# Patient Record
Sex: Male | Born: 1985 | Race: Black or African American | Hispanic: No | Marital: Single | Smoking: Never smoker
Health system: Southern US, Community
[De-identification: ages and names within clinical notes are randomized; demographics above are authoritative.]

## PROBLEM LIST (undated history)

## (undated) DIAGNOSIS — K2970 Gastritis, unspecified, without bleeding: Secondary | ICD-10-CM

## (undated) DIAGNOSIS — R079 Chest pain, unspecified: Secondary | ICD-10-CM

## (undated) DIAGNOSIS — D573 Sickle-cell trait: Secondary | ICD-10-CM

## (undated) DIAGNOSIS — G43909 Migraine, unspecified, not intractable, without status migrainosus: Secondary | ICD-10-CM

## (undated) HISTORY — DX: Migraine, unspecified, not intractable, without status migrainosus: G43.909

## (undated) HISTORY — DX: Gastritis, unspecified, without bleeding: K29.70

## (undated) HISTORY — PX: WISDOM TOOTH EXTRACTION: SHX21

## (undated) HISTORY — DX: Chest pain, unspecified: R07.9

---
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
## 2007-04-27 ENCOUNTER — Encounter: Admission: RE | Admit: 2007-04-27 | Discharge: 2007-04-27 | Payer: Self-pay | Admitting: Family Medicine

## 2009-12-06 ENCOUNTER — Emergency Department (HOSPITAL_COMMUNITY): Admission: EM | Admit: 2009-12-06 | Discharge: 2009-12-06 | Payer: Self-pay | Admitting: Emergency Medicine

## 2011-03-08 NOTE — Consult Note (Signed)
[place]. [facility]  Patient:    Zachary Roy, Zachary Roy                      MRN: [id number] Proc. Date: 07/09/00 Adm. Date:  [id number] Attending:  Leonia Corona CC:         Elana Alm. Eliezer Lofts., M.D.  Shuaib M. Leeanne Mannan, M.D.   Consultation Report  CHIEF COMPLAINT:  Abdominal pain.  HISTORY OF PRESENT ILLNESS:  This is an 25 year old male with a long history of recurrent abdominal pain with negative workup, who was admitted last night with abdominal pain, question of early appendicitis on CT scan.  Since admission, white count has gone from 6.2 to 4.5 and exam has remained benign. CT has been reviewed and consensus is likelihood of appendicitis is low.  The patient had acute illness beginning with a sore throat one week ago.  With Tussionex and Robitussin, symptoms improved, and he three days ago developed worsening sore throat, diarrhea x 3-4, and crampy abdominal pain.  Mom brought patient to the emergency department two days ago, and pain resolved with morphine.  Next day, patient saw Dr. Nicholos Johns as an outpatient.  Strep test was positive, and he was started on amoxicillin and Pepcid AC.  GI referral was set for October 30 for evaluation of his recurrent abdominal pain.  Last night severe crampy pain, periumbilical without radiation, occurred.  No fever.  No vomiting except for the contrast.  No nasal congestion or cough.  No hematuria, dysuria, or blood in the stool.  The pain was similar to prior pain but this time did not radiate to the testicles.  The patient and mom deny any stresses at home or at school.  No history of abuse.  Prior history reveals initial abdominal pain in January.  Workup has included five testicular ultrasounds.  One shows slightly enlarged epididymis.  One with question of hydrocele.  Two negative CT scans of the abdomen and pelvis. Negative evaluation by pediatric urologist at [facility] and [facility].  Negative psychological  evaluation at [facility]  Pain improved over the summer but now has recurred.  PAST MEDICAL HISTORY:   Abdominal pain.  PAST SURGICAL HISTORY:  None.  MEDICATIONS:  Amoxicillin, Pepcid AC.  ALLERGIES:  No known drug allergies.  SOCIAL HISTORY:  Patient lives with mom, is in sixth grade at [employer].  Likes to stay to himself.  Enjoys _____ and running.  Enjoys math.  FAMILY HISTORY:  Mom has fibromylagia, gastroesophageal reflux disease, and sickle cell trait.  Dad has gastroesophageal reflux disease.  REVIEW OF SYSTEMS:  Negative for weight loss, dysuria, joint pain, visual changes, and negative in all system groups.  PHYSICAL EXAMINATION:  VITAL SIGNS:  Temperature 97.9, BP 115/68, pulse 62, respirations 22.  GENERAL:  WDWNBM, resting comfortably.  HEENT:  Pupils are equal, round and reactive to light.  Oropharynx erythematous.  NECK:  Supple.  No lymphadenopathy.  CARDIAC:  Regular rate and rhythm without murmur.  LUNGS:  Clear to auscultation.  ABDOMEN:  Soft, nondistended, nontender, positive bowel sounds, no rebound or guarding.  GROIN:  Normal male genitalia.  No testicular masses or pain.  EXTREMITIES:  No edema, no joint swelling.  LABORATORY DATA:  White count 6.5, which has come down from 9.5.  ASSESSMENT:  An 25 year old male with chronic recurrent abdominal pain with extensive negative workup.  Current episode may have been initially by strep throat.  I still consider underlying psychological gain.  GI referral is reasonable as an outpatient.  PLAN:  Agree with continued monitoring overnight.  He will remain on penicillin for strep throat.  Will ask Kathrynn Running to see him in the morning, and GI evaluation as an outpatient. DD:  07/09/00 TD:  07/10/00 Job: [id number]

## 2011-03-08 NOTE — Op Note (Signed)
Zachary Roy, Zachary Roy               ACCOUNT NO.:  [id number]   MEDICAL RECORD NO.:  [id number]          PATIENT TYPE:  AMB   LOCATION:  SDS                          FACILITY:  [facility]   PHYSICIAN:  Jon Gills, M.D.  DATE OF BIRTH:  1985-11-24   DATE OF PROCEDURE:  05/09/2006  DATE OF DISCHARGE:  05/09/2006                                 OPERATIVE REPORT   PREOP DIAGNOSIS:  Chest pain, hematemesis and dysphagia.   POSTOP DIAGNOSIS:  Mild - moderate reflux esophagitis.   NAME OF OPERATION:  Upper GI endoscopy with biopsy.   SURGEON:  Jon Gills, MD   ASSISTANT:  None.   DESCRIPTION OF FINDINGS:  Following informed written consent, the patient  was taken to the operating room and placed under general anesthesia with  continuous cardiopulmonary monitoring.  He remained in the supine position  and Olympus endoscope was passed by mouth without difficulty.  Several  linear erosions were present in the distal esophagus but otherwise the  esophagus, stomach and duodenum were grossly normal.  A solitary gastric  biopsy was negative for Helicobacter.  Esophageal biopsies confirmed mild -  moderate reflux esophagitis.  Gastric and duodenal biopsies were  unremarkable.  The endoscope was gradually withdrawn and the patient was  awakened and taken to recovery room in satisfactory condition.  He will be  released later today to the care of his family.  I anticipate adding Reglan  therapy to his PPI medication.   DESCRIPTION AND TECHNICAL PROCEDURE USED:  Olympus GIF - 140 endoscope with  cold biopsy forceps.   DESCRIPTION OF SPECIMENS REMOVED:  Esophagus x3 in formalin, gastric x1 in  CLOtest, gastric x3 in formalin, duodenum x3 in formalin.           ______________________________  Jon Gills, M.D.     JHC/MEDQ  D:  05/23/2006  T:  05/23/2006  Job:  [id number]   cc:   Edson Snowball, M.D.

## 2011-11-12 ENCOUNTER — Encounter (HOSPITAL_COMMUNITY): Payer: Self-pay | Admitting: Emergency Medicine

## 2011-11-12 ENCOUNTER — Emergency Department (HOSPITAL_COMMUNITY)
Admission: EM | Admit: 2011-11-12 | Discharge: 2011-11-12 | Disposition: A | Discharge status: Home / Self Care | Payer: No Typology Code available for payment source | Attending: Emergency Medicine | Admitting: Emergency Medicine

## 2011-11-12 DIAGNOSIS — M542 Cervicalgia: Secondary | ICD-10-CM | POA: Insufficient documentation

## 2011-11-12 DIAGNOSIS — R51 Headache: Secondary | ICD-10-CM | POA: Insufficient documentation

## 2011-11-12 DIAGNOSIS — T1490XA Injury, unspecified, initial encounter: Secondary | ICD-10-CM | POA: Insufficient documentation

## 2011-11-12 HISTORY — DX: Sickle-cell trait: D57.3

## 2011-11-12 MED ORDER — DIAZEPAM 5 MG PO TABS: 5.0000 mg | ORAL_TABLET | Freq: Two times a day (BID) | ORAL | Status: AC
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
## 2011-11-14 NOTE — ED Provider Notes (Signed)
Medical screening examination/treatment/procedure(s) were performed by non-physician practitioner and as supervising physician I was immediately available for consultation/collaboration.   Forbes Cellar, MD 11/14/11 1325

## 2012-01-15 ENCOUNTER — Encounter (HOSPITAL_COMMUNITY): Payer: Self-pay | Admitting: *Deleted

## 2012-01-15 ENCOUNTER — Emergency Department (INDEPENDENT_AMBULATORY_CARE_PROVIDER_SITE_OTHER)
Admission: EM | Admit: 2012-01-15 | Discharge: 2012-01-15 | Disposition: A | Discharge status: Home / Self Care | Payer: Self-pay | Source: Home / Self Care | Attending: Emergency Medicine | Admitting: Emergency Medicine

## 2012-01-15 DIAGNOSIS — G43909 Migraine, unspecified, not intractable, without status migrainosus: Secondary | ICD-10-CM

## 2012-01-15 MED ORDER — MELATONIN 3 MG PO TABS: 3.0000 mg | ORAL_TABLET | Freq: Every day | ORAL | Status: DC

## 2012-01-15 MED ORDER — DEXAMETHASONE SODIUM PHOSPHATE 10 MG/ML IJ SOLN
INTRAMUSCULAR | Status: AC
  Filled 2012-01-15: qty 1

## 2012-01-15 MED ORDER — DEXAMETHASONE SODIUM PHOSPHATE 10 MG/ML IJ SOLN
10.0000 mg | Freq: Once | INTRAMUSCULAR | Status: AC
  Administered 2012-01-15: 10 mg via INTRAMUSCULAR

## 2012-01-15 MED ORDER — PREDNISONE 5 MG PO KIT: 1.0000 | PACK | Freq: Every day | ORAL | Status: DC

## 2012-01-15 MED ORDER — DIPHENHYDRAMINE HCL 50 MG/ML IJ SOLN
INTRAMUSCULAR | Status: AC
  Filled 2012-01-15: qty 1

## 2012-01-15 MED ORDER — DIPHENHYDRAMINE HCL 50 MG/ML IJ SOLN
25.0000 mg | Freq: Once | INTRAMUSCULAR | Status: AC
  Administered 2012-01-15: 25 mg via INTRAMUSCULAR

## 2012-01-15 NOTE — Discharge Instructions (Signed)
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
## 2012-01-22 ENCOUNTER — Encounter (HOSPITAL_COMMUNITY): Payer: Self-pay

## 2012-01-22 ENCOUNTER — Emergency Department (HOSPITAL_COMMUNITY)
Admission: EM | Admit: 2012-01-22 | Discharge: 2012-01-22 | Disposition: A | Discharge status: Home / Self Care | Payer: PRIVATE HEALTH INSURANCE | Attending: Emergency Medicine | Admitting: Emergency Medicine

## 2012-01-22 DIAGNOSIS — K2970 Gastritis, unspecified, without bleeding: Secondary | ICD-10-CM | POA: Insufficient documentation

## 2012-01-22 DIAGNOSIS — J45909 Unspecified asthma, uncomplicated: Secondary | ICD-10-CM | POA: Insufficient documentation

## 2012-01-22 DIAGNOSIS — K920 Hematemesis: Secondary | ICD-10-CM | POA: Insufficient documentation

## 2012-01-22 MED ORDER — SUCRALFATE 1 G PO TABS: 1.0000 g | ORAL_TABLET | Freq: Four times a day (QID) | ORAL | Status: DC

## 2012-01-22 MED ORDER — ONDANSETRON 8 MG PO TBDP
8.0000 mg | ORAL_TABLET | Freq: Once | ORAL | Status: AC
  Administered 2012-01-22: 8 mg via ORAL
  Filled 2012-01-22: qty 1

## 2012-01-22 MED ORDER — OMEPRAZOLE 20 MG PO CPDR: 20.0000 mg | DELAYED_RELEASE_CAPSULE | Freq: Every day | ORAL | Status: DC

## 2012-01-22 MED ORDER — ONDANSETRON HCL 4 MG PO TABS: 4.0000 mg | ORAL_TABLET | Freq: Four times a day (QID) | ORAL | Status: AC

## 2012-01-22 MED ORDER — PANTOPRAZOLE SODIUM 40 MG PO TBEC
40.0000 mg | DELAYED_RELEASE_TABLET | Freq: Every day | ORAL | Status: DC
  Administered 2012-01-22: 40 mg via ORAL
  Filled 2012-01-22: qty 1

## 2012-01-22 MED ORDER — SUCRALFATE 1 G PO TABS
1.0000 g | ORAL_TABLET | Freq: Once | ORAL | Status: AC
  Administered 2012-01-22: 1 g via ORAL
  Filled 2012-01-22: qty 1

## 2012-01-22 NOTE — ED Notes (Signed)
Pt complains of seeing blood in his vomit tonight, he also complains of a migraine

## 2012-01-22 NOTE — Discharge Instructions (Signed)
Take medications as prescribed.  Return to the ER for continued vomiting, increased blood in your vomiting, worsening pain, or other concerning symptoms.  Follow up with your doctor for recheck in 1 week.  Gastritis Gastritis is an inflammation (the body's way of reacting to injury and/or infection) of the stomach. It is often caused by viral or bacterial (germ) infections. It can also be caused by chemicals (including alcohol) and medications. This illness may be associated with generalized malaise (feeling tired, not well), cramps, and fever. The illness may last 2 to 7 days. If symptoms of gastritis continue, gastroscopy (looking into the stomach with a telescope-like instrument), biopsy (taking tissue samples), and/or blood tests may be necessary to determine the cause. Antibiotics will not affect the illness unless there is a bacterial infection present. One common bacterial cause of gastritis is an organism known as H. Pylori. This can be treated with antibiotics. Other forms of gastritis are caused by too much acid in the stomach. They can be treated with medications such as H2 blockers and antacids. Home treatment is usually all that is needed. Young children will quickly become dehydrated (loss of body fluids) if vomiting and diarrhea are both present. Medications may be given to control nausea. Medications are usually not given for diarrhea unless especially bothersome. Some medications slow the removal of the virus from the gastrointestinal tract. This slows down the healing process. HOME CARE INSTRUCTIONS Home care instructions for nausea and vomiting:  For adults: drink small amounts of fluids often. Drink at least 2 quarts a day. Take sips frequently. Do not drink large amounts of fluid at one time. This may worsen the nausea.   Only take over-the-counter or prescription medicines for pain, discomfort, or fever as directed by your caregiver.   Drink clear liquids only. Those are anything you  can see through such as water, broth, or soft drinks.   Once you are keeping clear liquids down, you may start full liquids, soups, juices, and ice cream or sherbet. Slowly add bland (plain, not spicy) foods to your diet.  Home care instructions for diarrhea:  Diarrhea can be caused by bacterial infections or a virus. Your condition should improve with time, rest, fluids, and/or anti-diarrheal medication.   Until your diarrhea is under control, you should drink clear liquids often in small amounts. Clear liquids include: water, broth, jell-o water and weak tea.  Avoid:  Milk.   Fruits.   Tobacco.   Alcohol.   Extremely hot or cold fluids.   Too much intake of anything at one time.  When your diarrhea stops you may add the following foods, which help the stool to become more formed:  Rice.   Bananas.   Apples without skin.   Dry toast.  Once these foods are tolerated you may add low-fat yogurt and low-fat cottage cheese. They will help to restore the normal bacterial balance in your bowel. Wash your hands well to avoid spreading bacteria (germ) or virus. SEEK IMMEDIATE MEDICAL CARE IF:   You are unable to keep fluids down.   Vomiting or diarrhea become persistent (constant).   Abdominal pain develops, increases, or localizes. (Right sided pain can be appendicitis. Left sided pain in adults can be diverticulitis.)   You develop a fever (an oral temperature above 102 F (38.9 C)).   Diarrhea becomes excessive or contains blood or mucus.   You have excessive weakness, dizziness, fainting or extreme thirst.   You are not improving or you are getting  worse.   You have any other questions or concerns.  Document Released: 10/01/2001 Document Revised: 09/26/2011 Document Reviewed: 10/07/2005 [facility] Patient Information 2012 [place], [place].

## 2012-01-22 NOTE — ED Provider Notes (Signed)
History     CSN: [id number]  Arrival date & time 01/22/12  0014   First MD Initiated Contact with Patient 01/22/12 0125      Chief Complaint  Patient presents with  . Hematemesis    (Consider location/radiation/quality/duration/timing/severity/associated sxs/prior treatment) HPI 26 year old male presents to emergency department with report of one episode of vomiting with streaks of blood. Patient reports he has had ongoing pain after eating for several months. He has not sought evaluation for this. Patient was seen in urgent care 2 to headaches, and was started on melatonin and prednisone. Patient reports some upper abdominal pain currently. No fever no chills no diarrhea. He has a mild headache at this time which is consistent with his normal headaches. Patient reports having upper and lower endoscopy done about 5 years ago. He does not remember why he had this done or what the results were. No further vomiting. No change in the color of his stools. Patient specifically denies any black tarry or sticky stools. Past Medical History  Diagnosis Date  . Asthma   . Sickle cell trait     History reviewed. No pertinent past surgical history.  Family History  Problem Relation Age of Onset  . Diabetes Mother   . Diabetes Other     History  Substance Use Topics  . Smoking status: Never Smoker   . Smokeless tobacco: Not on file  . Alcohol Use: No      Review of Systems  All other systems reviewed and are negative.   other than stated in history of present illness  Allergies  Motrin and Codeine  Home Medications   Current Outpatient Rx  Name Route Sig Dispense Refill  . MELATONIN 3 MG PO TABS Oral Take 1 tablet (3 mg total) by mouth at bedtime. 30 tablet 0  . PREDNISONE 5 MG PO KIT Oral Take 1 kit (5 mg total) by mouth daily after breakfast. Prednisone 5 mg 6 day dosepack.  Take as directed. 1 kit 0  . OMEPRAZOLE 20 MG PO CPDR Oral Take 1 capsule (20 mg total) by mouth  daily. 20 capsule 0  . ONDANSETRON HCL 4 MG PO TABS Oral Take 1 tablet (4 mg total) by mouth every 6 (six) hours. 12 tablet 0  . SUCRALFATE 1 G PO TABS Oral Take 1 tablet (1 g total) by mouth 4 (four) times daily. 30 tablet 0    BP 141/64  Pulse 71  Temp(Src) 97.7 F (36.5 C) (Oral)  Resp 18  Wt 150 lb (68.04 kg)  SpO2 99%  Physical Exam  Nursing note and vitals reviewed. Constitutional: He is oriented to person, place, and time. He appears well-developed and well-nourished.  HENT:  Head: Normocephalic and atraumatic.  Right Ear: External ear normal.  Left Ear: External ear normal.  Nose: Nose normal.  Mouth/Throat: Oropharynx is clear and moist.  Eyes: Conjunctivae and EOM are normal. Pupils are equal, round, and reactive to light.  Neck: Normal range of motion. Neck supple. No JVD present. No tracheal deviation present. No thyromegaly present.  Cardiovascular: Normal rate, regular rhythm, normal heart sounds and intact distal pulses.  Exam reveals no gallop and no friction rub.   No murmur heard. Pulmonary/Chest: Effort normal and breath sounds normal. No stridor. No respiratory distress. He has no wheezes. He has no rales. He exhibits no tenderness.  Abdominal: Soft. Bowel sounds are normal. He exhibits no distension and no mass. There is tenderness (mild epigastric tenderness). There is no rebound  and no guarding.  Musculoskeletal: Normal range of motion. He exhibits no edema and no tenderness.  Lymphadenopathy:    He has no cervical adenopathy.  Neurological: He is oriented to person, place, and time. He exhibits normal muscle tone. Coordination normal.  Skin: Skin is dry. No rash noted. No erythema. No pallor.  Psychiatric: He has a normal mood and affect. His behavior is normal. Judgment and thought content normal.    ED Course  Procedures (including critical care time)  Labs Reviewed - No data to display No results found.   1. Gastritis   2. Vomiting blood        MDM  26 year old male with one episode of vomiting with streaks of blood. History suggests possible gastritis or peptic ulcer disease may have been exacerbated with recent prednisone use. Patient clinically appears well no signs of active GI bleeding hemodynamically stable. Will start patient on Prilosec and Carafate. Patient to followup with primary care Dr. for possible referral to GI        Olivia Mackie, MD 01/22/12 [phone]

## 2012-01-22 NOTE — ED Notes (Signed)
Pt presents with c/c of hematemesis.  St's he only vomited once and he noticed copious amounts of blood in his vomit.  No signs of hypovolemia.  No dizziness/lightheadedness/SOB.  Cap refill < 3 seconds.  Mentation WNL.

## 2012-02-25 ENCOUNTER — Encounter (HOSPITAL_COMMUNITY): Payer: Self-pay | Admitting: *Deleted

## 2012-02-25 ENCOUNTER — Emergency Department (HOSPITAL_COMMUNITY)
Admission: EM | Admit: 2012-02-25 | Discharge: 2012-02-26 | Disposition: A | Discharge status: Home / Self Care | Payer: PRIVATE HEALTH INSURANCE | Attending: Emergency Medicine | Admitting: Emergency Medicine

## 2012-02-25 ENCOUNTER — Emergency Department (HOSPITAL_COMMUNITY): Payer: PRIVATE HEALTH INSURANCE

## 2012-02-25 DIAGNOSIS — R0602 Shortness of breath: Secondary | ICD-10-CM | POA: Insufficient documentation

## 2012-02-25 DIAGNOSIS — R079 Chest pain, unspecified: Secondary | ICD-10-CM | POA: Insufficient documentation

## 2012-02-25 DIAGNOSIS — J45909 Unspecified asthma, uncomplicated: Secondary | ICD-10-CM | POA: Insufficient documentation

## 2012-02-25 LAB — COMPREHENSIVE METABOLIC PANEL
ALT: 16 U/L (ref 0–53)
Alkaline Phosphatase: 49 U/L (ref 39–117)
CO2: 29 mEq/L (ref 19–32)
GFR calc Af Amer: 88 mL/min — ABNORMAL LOW (ref >90)
GFR calc non Af Amer: 76 mL/min — ABNORMAL LOW (ref >90)
Glucose, Bld: 91 mg/dL (ref 70–99)
Potassium: 4.6 mEq/L (ref 3.5–5.1)
Sodium: 141 mEq/L (ref 135–145)

## 2012-02-25 LAB — CBC
Hemoglobin: 15 g/dL (ref 13.0–17.0)
MCV: 82.8 fL (ref 78.0–100.0)
Platelets: 245 10*3/uL (ref 150–400)
RBC: 5.22 MIL/uL (ref 4.22–5.81)
WBC: 7.6 10*3/uL (ref 4.0–10.5)

## 2012-02-25 LAB — DIFFERENTIAL
Eosinophils Relative: 2 % (ref 0–5)
Lymphocytes Relative: 20 % (ref 12–46)
Lymphs Abs: 1.5 10*3/uL (ref 0.7–4.0)
Monocytes Relative: 6 % (ref 3–12)
Neutrophils Relative %: 72 % (ref 43–77)

## 2012-02-25 NOTE — ED Notes (Signed)
The pt is c/o mid-chest pain for 3-4 days with some sob none now

## 2012-02-26 MED ORDER — COLCHICINE 0.6 MG PO TABS: 0.6000 mg | ORAL_TABLET | Freq: Every day | ORAL | Status: DC

## 2012-02-26 NOTE — Discharge Instructions (Signed)
Return to the ED with any concerns including difficulty breathing, worsening pain, leg swelling, fainting, decreased level of alertness/lethargy, or any other alarming symptoms °

## 2012-02-26 NOTE — ED Notes (Signed)
Pt denies any questions upon discharge. 

## 2012-02-26 NOTE — ED Provider Notes (Signed)
History     CSN: [id number]  Arrival date & time 02/25/12  1933   First MD Initiated Contact with Patient 02/25/12 2333      Chief Complaint  Patient presents with  . Chest Pain    (Consider location/radiation/quality/duration/timing/severity/associated sxs/prior treatment) HPI Pt presents with c/o chest pain and difficulty breathing.  He states the symptoms have been going on for several weeks, but worse over the past few days.  Pain is worse with deep breaths.  Also chest wall is sore to touch.  No leg swelling, no hx dvt/pe, no recent travel injuries or surgeries.  Mild cough, no fever/chills.  No rash or other recent illness.  He has not tried any treatement prior to arrival.  There are no alleviating or modifying factors.  There are no other associated systemic treatments   Past Medical History  Diagnosis Date  . Asthma   . Sickle cell trait     History reviewed. No pertinent past surgical history.  Family History  Problem Relation Age of Onset  . Diabetes Mother   . Diabetes Other     History  Substance Use Topics  . Smoking status: Never Smoker   . Smokeless tobacco: Not on file  . Alcohol Use: No      Review of Systems ROS reviewed and all otherwise negative except for mentioned in HPI  Allergies  Motrin and Codeine  Home Medications   Current Outpatient Rx  Name Route Sig Dispense Refill  . MELATONIN 3 MG PO TABS Oral Take 1 tablet (3 mg total) by mouth at bedtime. 30 tablet 0  . OMEPRAZOLE 20 MG PO CPDR Oral Take 1 capsule (20 mg total) by mouth daily. 20 capsule 0  . SUCRALFATE 1 G PO TABS Oral Take 1 tablet (1 g total) by mouth 4 (four) times daily. 30 tablet 0  . COLCHICINE 0.6 MG PO TABS Oral Take 1 tablet (0.6 mg total) by mouth daily. 14 tablet 0    BP 118/67  Pulse 71  Temp(Src) 98.3 F (36.8 C) (Oral)  Resp 20  SpO2 100% Vitals reviewed Physical Exam Physical Examination: General appearance - alert, well appearing, and in no  distress Mental status - alert, oriented to person, place, and time Eyes - pupils equal and reactive, no conjunctival injection or scleral icterus Mouth - mucous membranes moist, pharynx normal without lesions Chest - clear to auscultation, no wheezes, rales or rhonchi, symmetric air entry, chest wall tender to palpation at costrochondral junction Heart - normal rate, regular rhythm, normal S1, S2, no murmurs, rubs, clicks or gallops Abdomen - soft, nontender, nondistended, no masses or organomegaly, nabs Neurological - alert, oriented, normal speech, no focal findings Musculoskeletal - no joint tenderness, deformity or swelling Extremities - peripheral pulses normal, no pedal edema, no clubbing or cyanosis Skin - normal coloration and turgor, no rashes  ED Course  Procedures (including critical care time)  Date: 02/26/2012  Rate: 63  Rhythm: normal sinus rhythm  QRS Axis: normal  Intervals: normal  ST/T Wave abnormalities: ST elevations diffusely c/w early repolarization versus pericarditis  Conduction Disutrbances:none  Narrative Interpretation:   Old EKG Reviewed: none available    Labs Reviewed  COMPREHENSIVE METABOLIC PANEL - Abnormal; Notable for the following:    Total Bilirubin 1.9 (*)    GFR calc non Af Amer 76 (*)    GFR calc Af Amer 88 (*)    All other components within normal limits  CBC  DIFFERENTIAL  CK TOTAL  AND CKMB  TROPONIN I  POCT I-STAT TROPONIN I  LAB REPORT - SCANNED   Dg Chest 2 View  02/25/2012  *RADIOLOGY REPORT*  Clinical Data: Chest pain.  Short of breath.  Asthma.  CHEST - 2 VIEW  Comparison: 12/06/2009.  Findings:  Cardiopericardial silhouette within normal limits. Mediastinal contours normal. Trachea midline.  No airspace disease or effusion. Hyperinflation is present.  Hyperinflation is greater than on the prior exam of 12/06/2009.  IMPRESSION: Hyperinflation which can be associated with asthma.  Original Report Authenticated By: Andreas Newport,  M.D.     1. Chest pain       MDM  Pt presents with sharp chest pain and some shortness of breath.  EKG shows possible pericarditis.  Exam c/w possible costochondritis versus pericarditis, No cardiomegaly or other acute findings on CXR.  PERC score 0 so very low risk for PE.  Pt states he has hives with both aspirin and ibuprofen, therefore based on UTD reccommendations will start on colchicine.  Pt given cardiology information for followup.  Discharged with stirct return precautions.  He is agreeable with this plan.         Ethelda Chick, MD 02/27/12 [phone]

## 2012-03-08 ENCOUNTER — Emergency Department (HOSPITAL_COMMUNITY)
Admission: EM | Admit: 2012-03-08 | Discharge: 2012-03-08 | Disposition: A | Discharge status: Home / Self Care | Payer: PRIVATE HEALTH INSURANCE | Attending: Emergency Medicine | Admitting: Emergency Medicine

## 2012-03-08 ENCOUNTER — Encounter (HOSPITAL_COMMUNITY): Payer: Self-pay | Admitting: Physical Medicine and Rehabilitation

## 2012-03-08 ENCOUNTER — Emergency Department (HOSPITAL_COMMUNITY): Payer: PRIVATE HEALTH INSURANCE

## 2012-03-08 DIAGNOSIS — R071 Chest pain on breathing: Secondary | ICD-10-CM | POA: Insufficient documentation

## 2012-03-08 DIAGNOSIS — D573 Sickle-cell trait: Secondary | ICD-10-CM | POA: Insufficient documentation

## 2012-03-08 DIAGNOSIS — R079 Chest pain, unspecified: Secondary | ICD-10-CM | POA: Insufficient documentation

## 2012-03-08 LAB — DIFFERENTIAL
Eosinophils Absolute: 0.2 10*3/uL (ref 0.0–0.7)
Eosinophils Relative: 4 % (ref 0–5)
Lymphocytes Relative: 25 % (ref 12–46)
Lymphs Abs: 1.6 10*3/uL (ref 0.7–4.0)
Monocytes Relative: 11 % (ref 3–12)
Neutrophils Relative %: 60 % (ref 43–77)

## 2012-03-08 LAB — POCT I-STAT, CHEM 8
BUN: 8 mg/dL (ref 6–23)
Creatinine, Ser: 1.1 mg/dL (ref 0.50–1.35)
Glucose, Bld: 83 mg/dL (ref 70–99)
Potassium: 3.8 mEq/L (ref 3.5–5.1)
Sodium: 144 mEq/L (ref 135–145)

## 2012-03-08 LAB — CBC
Hemoglobin: 13.8 g/dL (ref 13.0–17.0)
MCH: 28.5 pg (ref 26.0–34.0)
MCV: 82.2 fL (ref 78.0–100.0)
Platelets: 181 10*3/uL (ref 150–400)
RBC: 4.84 MIL/uL (ref 4.22–5.81)
WBC: 6.2 10*3/uL (ref 4.0–10.5)

## 2012-03-08 LAB — POCT I-STAT TROPONIN I

## 2012-03-08 MED ORDER — DICLOFENAC SODIUM 75 MG PO TBEC: 75.0000 mg | DELAYED_RELEASE_TABLET | Freq: Two times a day (BID) | ORAL | Status: AC

## 2012-03-08 MED ORDER — METHOCARBAMOL 500 MG PO TABS
500.0000 mg | ORAL_TABLET | Freq: Once | ORAL | Status: AC
  Administered 2012-03-08: 500 mg via ORAL
  Filled 2012-03-08: qty 1

## 2012-03-08 MED ORDER — TRAMADOL HCL 50 MG PO TABS: 50.0000 mg | ORAL_TABLET | Freq: Four times a day (QID) | ORAL | Status: AC | PRN

## 2012-03-08 MED ORDER — KETOROLAC TROMETHAMINE 60 MG/2ML IM SOLN
60.0000 mg | Freq: Once | INTRAMUSCULAR | Status: AC
  Administered 2012-03-08: 60 mg via INTRAMUSCULAR
  Filled 2012-03-08: qty 2

## 2012-03-08 MED ORDER — METHOCARBAMOL 500 MG PO TABS: 500.0000 mg | ORAL_TABLET | Freq: Two times a day (BID) | ORAL | Status: AC

## 2012-03-08 NOTE — ED Notes (Signed)
Spoke with the PA again and then we both spoke with the patient and the PA advised that it was ok for the patient to get the Toradol so 60mg s of Toradol was given  As ordered.

## 2012-03-08 NOTE — ED Notes (Signed)
Toradol 60 mgs ordered but held due to patient having allergies to Aspirin and motrin contact pharmacy and medication was held.

## 2012-03-08 NOTE — ED Notes (Signed)
Back from xray

## 2012-03-08 NOTE — ED Notes (Addendum)
Pt presents to department for evaluation of L sided non radiating chest pain. Onset this morning. 8/10 pain upon arrival. Describes as constant sharp sensation. Pain becomes worse with deep breathing and movement. Skin warm and dry. Respirations unlabored. He is conscious alert and oriented x4.

## 2012-03-08 NOTE — ED Notes (Signed)
Patient transported to X-ray 

## 2012-03-08 NOTE — ED Provider Notes (Signed)
Medical screening examination/treatment/procedure(s) were performed by non-physician practitioner and as supervising physician I was immediately available for consultation/collaboration.   Shelda Jakes, MD 03/08/12 [phone]

## 2012-03-08 NOTE — ED Provider Notes (Signed)
History     CSN: [id number]  Arrival date & time 03/08/12  1341   First MD Initiated Contact with Patient 03/08/12 1404      2:16 PM HPI Patient reports persistently worsening right-sided chest pain. States pain which sharp and feels like a muscle strain. Reports pain is worse when pushing on it and with leaning forward. Reports also worse with taking deep breaths. Denies shortness of breath, fever, cough, back pain, injury, family history of early heart disease or sudden death. Patient has an appointment to followup with our cardiology on June 18. Reports no improvement with acetaminophen which was given to him at  discharge on 03/08/2012. Patient indicates he has an allergy to Motrin. Describes his allergy is dizziness. States she's not had this since she was a child. Will attempt to give him anti-inflammatory medication while waiting in the ED to watch for reaction. Patient and family agree on plan.   Patient is a 26 y.o. male presenting with chest pain. The history is provided by the patient.  Chest Pain The chest pain began more than 2 weeks ago. Pertinent negatives for primary symptoms include no fever, no fatigue, no shortness of breath, no cough, no palpitations, no abdominal pain, no nausea, no vomiting and no dizziness.  Pertinent negatives for associated symptoms include no diaphoresis, no numbness and no weakness.     Past Medical History  Diagnosis Date  . Asthma   . Sickle cell trait     No past surgical history on file.  Family History  Problem Relation Age of Onset  . Diabetes Mother   . Diabetes Other     History  Substance Use Topics  . Smoking status: Never Smoker   . Smokeless tobacco: Not on file  . Alcohol Use: No      Review of Systems  Constitutional: Negative for fever, chills, diaphoresis and fatigue.  HENT: Negative for sore throat.   Respiratory: Negative for cough and shortness of breath.   Cardiovascular: Positive for chest pain. Negative for  palpitations and leg swelling.  Gastrointestinal: Negative for nausea, vomiting and abdominal pain.  Musculoskeletal: Negative for back pain.  Neurological: Negative for dizziness, weakness, numbness and headaches.  All other systems reviewed and are negative.    Allergies  Motrin; Aspirin; and Codeine  Home Medications   Current Outpatient Rx  Name Route Sig Dispense Refill  . ACETAMINOPHEN 325 MG PO TABS Oral Take 325 mg by mouth every 6 (six) hours as needed. For pain    . COLCHICINE 0.6 MG PO TABS Oral Take 0.6 mg by mouth daily.      BP 139/66  Pulse 66  Temp(Src) 98.4 F (36.9 C) (Oral)  Resp 18  SpO2 98%  Physical Exam  Constitutional: He is oriented to person, place, and time. He appears well-developed and well-nourished.  HENT:  Head: Normocephalic and atraumatic.  Nose: Nose normal.  Mouth/Throat: Oropharynx is clear and moist. No oropharyngeal exudate.  Eyes: Conjunctivae are normal. Pupils are equal, round, and reactive to light.  Neck: Normal range of motion. Neck supple.  Cardiovascular: Normal rate, regular rhythm and normal heart sounds.  Exam reveals no gallop and no friction rub.   No murmur heard. Pulmonary/Chest: Effort normal and breath sounds normal. No respiratory distress. He has no wheezes. He has no rales. He exhibits tenderness (Left anterior chest TTP ).  Abdominal: Soft. Bowel sounds are normal. He exhibits no distension and no mass. There is no tenderness. There is no  rebound and no guarding.  Neurological: He is alert and oriented to person, place, and time.  Skin: Skin is warm and dry. No rash noted. No erythema. No pallor.  Psychiatric: He has a normal mood and affect. His behavior is normal.    ED Course  Procedures   Results for orders placed during the hospital encounter of 03/08/12  CBC      Component Value Range   WBC 6.2  4.0 - 10.5 (K/uL)   RBC 4.84  4.22 - 5.81 (MIL/uL)   Hemoglobin 13.8  13.0 - 17.0 (g/dL)   HCT 16.1  09.6  - 04.5 (%)   MCV 82.2  78.0 - 100.0 (fL)   MCH 28.5  26.0 - 34.0 (pg)   MCHC 34.7  30.0 - 36.0 (g/dL)   RDW 40.9  81.1 - 91.4 (%)   Platelets 181  150 - 400 (K/uL)  DIFFERENTIAL      Component Value Range   Neutrophils Relative 60  43 - 77 (%)   Neutro Abs 3.7  1.7 - 7.7 (K/uL)   Lymphocytes Relative 25  12 - 46 (%)   Lymphs Abs 1.6  0.7 - 4.0 (K/uL)   Monocytes Relative 11  3 - 12 (%)   Monocytes Absolute 0.7  0.1 - 1.0 (K/uL)   Eosinophils Relative 4  0 - 5 (%)   Eosinophils Absolute 0.2  0.0 - 0.7 (K/uL)   Basophils Relative 0  0 - 1 (%)   Basophils Absolute 0.0  0.0 - 0.1 (K/uL)  POCT I-STAT, CHEM 8      Component Value Range   Sodium 144  135 - 145 (mEq/L)   Potassium 3.8  3.5 - 5.1 (mEq/L)   Chloride 105  96 - 112 (mEq/L)   BUN 8  6 - 23 (mg/dL)   Creatinine, Ser 7.82  0.50 - 1.35 (mg/dL)   Glucose, Bld 83  70 - 99 (mg/dL)   Calcium, Ion 9.56  2.13 - 1.32 (mmol/L)   TCO2 28  0 - 100 (mmol/L)   Hemoglobin 13.9  13.0 - 17.0 (g/dL)   HCT 08.6  57.8 - 46.9 (%)  POCT I-STAT TROPONIN I      Component Value Range   Troponin i, poc 0.00  0.00 - 0.08 (ng/mL)   Comment 3             ED ECG REPORT   Date: 03/08/2012  EKG Time: 3:13 PM  Rate: 60  Rhythm: normal sinus rhythm and sinus arrhythmia,  normal EKG, normal sinus rhythm, unchanged from previous tracings  Axis: NML  Intervals:none  ST&T Change: Early repolarization  Narrative Interpretation: Unchanged since 02/25/2012  MDM   Labs and imaging within normal limits. No change in EKG. Patient is likely experiencing costochondral pain versus pericarditis as documented during harvested. Will prescribe patient with anti-inflammatory medications since he was able to tolerate Toradol and advised close followup to be kept with cardiology on June 18. Patient and family voiced understanding and are ready for discharge.    Patient had relief with medication. Did not develop urticaria. Will discharge home with Ultram and Robaxin  and diclofenac. Patient voices understanding and is ready for discharge     Thomasene Lot, Cordelia Poche 03/08/12 1533

## 2012-03-08 NOTE — Discharge Instructions (Signed)
Chest Pain (Nonspecific) It is often hard to give a specific diagnosis for the cause of chest pain. There is always a chance that your pain could be related to something serious, such as a heart attack or a blood clot in the lungs. You need to follow up with your caregiver for further evaluation. CAUSES   Heartburn.   Pneumonia or bronchitis.   Anxiety or stress.   Inflammation around your heart (pericarditis) or lung (pleuritis or pleurisy).   A blood clot in the lung.   A collapsed lung (pneumothorax). It can develop suddenly on its own (spontaneous pneumothorax) or from injury (trauma) to the chest.   Shingles infection (herpes zoster virus).  The chest wall is composed of bones, muscles, and cartilage. Any of these can be the source of the pain.  The bones can be bruised by injury.   The muscles or cartilage can be strained by coughing or overwork.   The cartilage can be affected by inflammation and become sore (costochondritis).  DIAGNOSIS  Lab tests or other studies, such as X-rays, electrocardiography, stress testing, or cardiac imaging, may be needed to find the cause of your pain.  TREATMENT   Treatment depends on what may be causing your chest pain. Treatment may include:   Acid blockers for heartburn.   Anti-inflammatory medicine.   Pain medicine for inflammatory conditions.   Antibiotics if an infection is present.   You may be advised to change lifestyle habits. This includes stopping smoking and avoiding alcohol, caffeine, and chocolate.   You may be advised to keep your head raised (elevated) when sleeping. This reduces the chance of acid going backward from your stomach into your esophagus.   Most of the time, nonspecific chest pain will improve within 2 to 3 days with rest and mild pain medicine.  HOME CARE INSTRUCTIONS   If antibiotics were prescribed, take your antibiotics as directed. Finish them even if you start to feel better.   For the next few  days, avoid physical activities that bring on chest pain. Continue physical activities as directed.   Do not smoke.   Avoid drinking alcohol.   Only take over-the-counter or prescription medicine for pain, discomfort, or fever as directed by your caregiver.   Follow your caregiver's suggestions for further testing if your chest pain does not go away.   Keep any follow-up appointments you made. If you do not go to an appointment, you could develop lasting (chronic) problems with pain. If there is any problem keeping an appointment, you must call to reschedule.  SEEK MEDICAL CARE IF:   You think you are having problems from the medicine you are taking. Read your medicine instructions carefully.   Your chest pain does not go away, even after treatment.   You develop a rash with blisters on your chest.  SEEK IMMEDIATE MEDICAL CARE IF:   You have increased chest pain or pain that spreads to your arm, neck, jaw, back, or abdomen.   You develop shortness of breath, an increasing cough, or you are coughing up blood.   You have severe back or abdominal pain, feel nauseous, or vomit.   You develop severe weakness, fainting, or chills.   You have a fever.  THIS IS AN EMERGENCY. Do not wait to see if the pain will go away. Get medical help at once. Call your local emergency services (911 in [place] Do not drive yourself to the hospital. MAKE SURE YOU:   Understand these instructions.     Will watch your condition.   Will get help right away if you are not doing well or get worse.  Document Released: 07/17/2005 Document Revised: 09/26/2011 Document Reviewed: 05/12/2008 [facility] Patient Information 2012 [place], [place].   Chest Wall Pain Chest wall pain is pain in or around the bones and muscles of your chest. It may take up to 6 weeks to get better. It may take longer if you must stay physically active in your work and activities.  CAUSES  Chest wall pain may happen on its own. However,  it may be caused by:  A viral illness like the flu.   Injury.   Coughing.   Exercise.   Arthritis.   Fibromyalgia.   Shingles.  HOME CARE INSTRUCTIONS   Avoid overtiring physical activity. Try not to strain or perform activities that cause pain. This includes any activities using your chest or your abdominal and side muscles, especially if heavy weights are used.   Put ice on the sore area.   Put ice in a plastic bag.   Place a towel between your skin and the bag.   Leave the ice on for 15 to 20 minutes per hour while awake for the first 2 days.   Only take over-the-counter or prescription medicines for pain, discomfort, or fever as directed by your caregiver.  SEEK IMMEDIATE MEDICAL CARE IF:   Your pain increases, or you are very uncomfortable.   You have a fever.   Your chest pain becomes worse.   You have new, unexplained symptoms.   You have nausea or vomiting.   You feel sweaty or lightheaded.   You have a cough with phlegm (sputum), or you cough up blood.  MAKE SURE YOU:   Understand these instructions.   Will watch your condition.   Will get help right away if you are not doing well or get worse.  Document Released: 10/07/2005 Document Revised: 09/26/2011 Document Reviewed: 06/03/2011 [facility] Patient Information 2012 [place], [place].  Costochondritis Costochondritis (Tietze syndrome), or costochondral separation, is a swelling and irritation (inflammation) of the tissue (cartilage) that connects your ribs with your breastbone (sternum). It may occur on its own (spontaneously), through damage caused by an accident (trauma), or simply from coughing or minor exercise. It may take up to 6 weeks to get better and longer if you are unable to be conservative in your activities. HOME CARE INSTRUCTIONS   Avoid exhausting physical activity. Try not to strain your ribs during normal activity. This would include any activities using chest, belly (abdominal), and  side muscles, especially if heavy weights are used.   Use ice for 15 to 20 minutes per hour while awake for the first 2 days. Place the ice in a plastic bag, and place a towel between the bag of ice and your skin.   Only take over-the-counter or prescription medicines for pain, discomfort, or fever as directed by your caregiver.  SEEK IMMEDIATE MEDICAL CARE IF:   Your pain increases or you are very uncomfortable.   You have a fever.   You develop difficulty with your breathing.   You cough up blood.   You develop worse chest pains, shortness of breath, sweating, or vomiting.   You develop new, unexplained problems (symptoms).  MAKE SURE YOU:   Understand these instructions.   Will watch your condition.   Will get help right away if you are not doing well or get worse.  Document Released: 07/17/2005 Document Revised: 09/26/2011 Document Reviewed: 05/25/2008 [employer] Patient  Information 2012 [place], [place].   Costochondritis Costochondritis (Tietze syndrome), or costochondral separation, is a swelling and irritation (inflammation) of the tissue (cartilage) that connects your ribs with your breastbone (sternum). It may occur on its own (spontaneously), through damage caused by an accident (trauma), or simply from coughing or minor exercise. It may take up to 6 weeks to get better and longer if you are unable to be conservative in your activities. HOME CARE INSTRUCTIONS   Avoid exhausting physical activity. Try not to strain your ribs during normal activity. This would include any activities using chest, belly (abdominal), and side muscles, especially if heavy weights are used.   Use ice for 15 to 20 minutes per hour while awake for the first 2 days. Place the ice in a plastic bag, and place a towel between the bag of ice and your skin.   Only take over-the-counter or prescription medicines for pain, discomfort, or fever as directed by your caregiver.  SEEK IMMEDIATE MEDICAL CARE  IF:   Your pain increases or you are very uncomfortable.   You have a fever.   You develop difficulty with your breathing.   You cough up blood.   You develop worse chest pains, shortness of breath, sweating, or vomiting.   You develop new, unexplained problems (symptoms).  MAKE SURE YOU:   Understand these instructions.   Will watch your condition.   Will get help right away if you are not doing well or get worse.  Document Released: 07/17/2005 Document Revised: 09/26/2011 Document Reviewed: 05/25/2008 [facility] Patient Information 2012 [place], [place].

## 2012-04-06 ENCOUNTER — Encounter: Payer: Self-pay | Admitting: *Deleted

## 2012-04-06 ENCOUNTER — Encounter: Payer: Self-pay | Admitting: Cardiovascular Disease

## 2012-04-06 DIAGNOSIS — R079 Chest pain, unspecified: Secondary | ICD-10-CM | POA: Insufficient documentation

## 2012-04-06 DIAGNOSIS — K2970 Gastritis, unspecified, without bleeding: Secondary | ICD-10-CM | POA: Insufficient documentation

## 2012-04-06 DIAGNOSIS — D573 Sickle-cell trait: Secondary | ICD-10-CM | POA: Insufficient documentation

## 2012-04-06 DIAGNOSIS — G43909 Migraine, unspecified, not intractable, without status migrainosus: Secondary | ICD-10-CM | POA: Insufficient documentation

## 2012-04-07 ENCOUNTER — Ambulatory Visit (INDEPENDENT_AMBULATORY_CARE_PROVIDER_SITE_OTHER): Payer: PRIVATE HEALTH INSURANCE | Admitting: Cardiovascular Disease

## 2012-04-07 ENCOUNTER — Encounter: Payer: Self-pay | Admitting: Cardiovascular Disease

## 2012-04-07 VITALS — BP 141/78 | HR 69 | Ht 71.0 in | Wt 156.4 lb

## 2012-04-07 DIAGNOSIS — D573 Sickle-cell trait: Secondary | ICD-10-CM

## 2012-04-07 DIAGNOSIS — R079 Chest pain, unspecified: Secondary | ICD-10-CM

## 2012-04-07 NOTE — Assessment & Plan Note (Signed)
Avoid dehydration F/U primary

## 2012-04-07 NOTE — Progress Notes (Signed)
Patient ID: Zachary Roy, male   DOB: [birth year], 26 y.o.   MRN: [id number] 26 yo seen in ER 5/19 for atypical chest pain.  R/O normal ECG and CXR.  Reviewed records.  Dx with constrochondritis or pericarditis.  Improved with NSAI's and colchicine.  Still with intermitant sharp pains.  Sometimes worse in left shoulder with over head movement.  Seems muscular/  No dyspnea or pleurisy.  No palpitaitons or syncope.  Compliant with meds. No recent trauma.  Uses treadmill at times no recent trauma.  History of sickle cell trait.  Denies drugs or ETOH.    ROS: Denies fever, malais, weight loss, blurry vision, decreased visual acuity, cough, sputum, SOB, hemoptysis, pleuritic pain, palpitaitons, heartburn, abdominal pain, melena, lower extremity edema, claudication, or rash.  All other systems reviewed and negative   General: Affect appropriate Healthy:  appears stated age HEENT: normal Neck supple with no adenopathy JVP normal no bruits no thyromegaly Lungs clear with no wheezing and good diaphragmatic motion Heart:  S1/S2 no murmur,rub, gallop or click PMI normal Abdomen: benighn, BS positve, no tenderness, no AAA no bruit.  No HSM or HJR Distal pulses intact with no bruits No edema Neuro non-focal Skin warm and dry No muscular weakness  Medications Current Outpatient Prescriptions  Medication Sig Dispense Refill  . acetaminophen (TYLENOL) 325 MG tablet Take 325 mg by mouth every 6 (six) hours as needed. For pain      . colchicine 0.6 MG tablet Take 0.6 mg by mouth daily.      . diclofenac (VOLTAREN) 75 MG EC tablet Take 1 tablet (75 mg total) by mouth 2 (two) times daily.  30 tablet  0  . methocarbamol (ROBAXIN) 500 MG tablet Take 500 mg by mouth as directed.      . traMADol (ULTRAM) 50 MG tablet Take 50 mg by mouth every 6 (six) hours as needed.        Allergies Motrin; Aspirin; and Codeine  Family History: Family History  Problem Relation Age of Onset  . Diabetes Mother   .  Diabetes Other     Social History: History   Social History  . Marital Status: Single    Spouse Name: N/A    Number of Children: N/A  . Years of Education: N/A   Occupational History  . Not on file.   Social History Main Topics  . Smoking status: Never Smoker   . Smokeless tobacco: Not on file  . Alcohol Use: No  . Drug Use: No  . Sexually Active:    Other Topics Concern  . Not on file   Social History Narrative  . No narrative on file    Electrocardiogram: 5/19  NSR early repol no evidence of pericardial inflamation  Assessment and Plan

## 2012-04-07 NOTE — Assessment & Plan Note (Signed)
Noncardiac sounding.  Normal exam with no rub.  F/U echo R/O effusion or congenital/pericardial disease

## 2012-04-07 NOTE — Patient Instructions (Addendum)
The current medical regimen is effective;  continue present plan and medications.  Your physician has requested that you have an echocardiogram. Echocardiography is a painless test that uses sound waves to create images of your heart. It provides your doctor with information about the size and shape of your heart and how well your heart's chambers and valves are working. This procedure takes approximately one hour. There are no restrictions for this procedure.  Follow up as needed. 

## 2012-04-10 ENCOUNTER — Ambulatory Visit (HOSPITAL_COMMUNITY): Payer: PRIVATE HEALTH INSURANCE | Attending: Cardiovascular Disease

## 2012-04-10 DIAGNOSIS — R079 Chest pain, unspecified: Secondary | ICD-10-CM | POA: Insufficient documentation

## 2012-04-10 DIAGNOSIS — R072 Precordial pain: Secondary | ICD-10-CM | POA: Insufficient documentation

## 2012-04-10 NOTE — Progress Notes (Signed)
Echocardiogram performed.  

## 2012-11-03 ENCOUNTER — Telehealth: Payer: Self-pay | Admitting: *Deleted

## 2012-11-03 DIAGNOSIS — R931 Abnormal findings on diagnostic imaging of heart and coronary circulation: Secondary | ICD-10-CM

## 2012-11-03 NOTE — Telephone Encounter (Signed)
NEEDS CARDIAC MRI . FOR F/U LOW EF  LEFT MESSAGE  PT TO HAVE DONE

## 2012-11-04 ENCOUNTER — Encounter: Payer: Self-pay | Admitting: Cardiovascular Disease

## 2012-11-10 ENCOUNTER — Other Ambulatory Visit: Payer: Self-pay | Admitting: *Deleted

## 2012-11-10 DIAGNOSIS — Z01810 Encounter for preprocedural cardiovascular examination: Secondary | ICD-10-CM

## 2012-11-10 NOTE — Telephone Encounter (Signed)
MRI SCHEDULED FOR 11-11-12 AT 12:00

## 2012-11-11 ENCOUNTER — Ambulatory Visit (HOSPITAL_COMMUNITY): Admission: RE | Admit: 2012-11-11 | Payer: Self-pay | Source: Ambulatory Visit

## 2013-04-16 ENCOUNTER — Encounter (HOSPITAL_COMMUNITY): Payer: Self-pay | Admitting: Emergency Medicine

## 2013-04-16 ENCOUNTER — Emergency Department (INDEPENDENT_AMBULATORY_CARE_PROVIDER_SITE_OTHER)
Admission: EM | Admit: 2013-04-16 | Discharge: 2013-04-16 | Disposition: A | Discharge status: Home / Self Care | Payer: Self-pay | Source: Home / Self Care | Attending: Emergency Medicine | Admitting: Emergency Medicine

## 2013-04-16 DIAGNOSIS — I319 Disease of pericardium, unspecified: Secondary | ICD-10-CM

## 2013-04-16 MED ORDER — DICLOFENAC SODIUM 75 MG PO TBEC: 75.0000 mg | DELAYED_RELEASE_TABLET | Freq: Two times a day (BID) | ORAL | Status: DC

## 2013-04-16 MED ORDER — TRAMADOL HCL 50 MG PO TABS: 100.0000 mg | ORAL_TABLET | Freq: Three times a day (TID) | ORAL | Status: DC | PRN

## 2013-04-16 MED ORDER — COLCHICINE 0.6 MG PO TABS: ORAL_TABLET | ORAL | Status: DC

## 2013-04-16 NOTE — ED Provider Notes (Signed)
Chief Complaint:   Chief Complaint  Patient presents with  . Chest Pain    History of Present Illness:   Zachary Roy is a 27 year old male who has had a history of left, some memory chest pain since last night. The pain does not radiate, it is nonpleuritic. It is worse if he does any lifting. He denies any associated fever, chills, coughing, wheezing, shortness of breath, palpitations, dizziness, syncope, nausea, vomiting, or abdominal pain. The patient has a history of pericarditis about a year ago and the pain feels about the same he saw Dr. Eden Emms and had echocardiogram. He was treated with diclofenac and colchicine and this seemed to relief his symptoms.  Review of Systems:  Other than noted above, the patient denies any of the following symptoms. Systemic:  No fever, chills, sweats, or fatigue. ENT:  No nasal congestion, rhinorrhea, or sore throat. Pulmonary:  No cough, wheezing, shortness of breath, sputum production, hemoptysis. Cardiac:  No palpitations, rapid heartbeat, dizziness, presyncope or syncope. GI:  No abdominal pain, heartburn, nausea, or vomiting. Ext:  No leg pain or swelling.  PMFSH:  Past medical history, family history, social history, meds, and allergies were reviewed and updated as needed.   Physical Exam:   Vital signs:  BP 131/69  Pulse 54  Temp(Src) 98.1 F (36.7 C)  Resp 17  SpO2 100% Gen:  Alert, oriented, in no distress, skin warm and dry. Eye:  PERRL, lids and conjunctivas normal.  Sclera non-icteric. ENT:  Mucous membranes moist, pharynx clear. Neck:  Supple, no adenopathy or tenderness.  No JVD. Lungs:  Clear to auscultation, no wheezes, rales or rhonchi.  No respiratory distress. Heart:  Regular rhythm.  No gallops, murmers, clicks or rubs. No pericardial friction rub was heard with the patient in multiple positions. Chest:  No chest wall tenderness. Abdomen:  Soft, nontender, no organomegaly or mass.  Bowel sounds normal.  No pulsatile  abdominal mass or bruit. Ext:  No edema.  No calf tenderness and Homann's sign negative.  Pulses full and equal. Skin:  Warm and dry.  No rash.   EKG:   Date: 04/16/2013  Rate: 53  Rhythm: sinus bradycardia  QRS Axis: normal  Intervals: normal  ST/T Wave abnormalities: early repolarization  Conduction Disutrbances:none  Narrative Interpretation:   Old EKG Reviewed: none available  Assessment:  The encounter diagnosis was Pericarditis.  I think this is probably pericarditis, although I could not hear a friction rub, and the EKG is not classic for pericarditis, although he does have some ST segment elevations in multiple leads. He was given refills on his diclofenac, colchicine, and tramadol and told to followup with his cardiologist.   Plan:   1.  The following meds were prescribed:   Discharge Medication List as of 04/16/2013  5:02 PM    START taking these medications   Details  diclofenac (VOLTAREN) 75 MG EC tablet Take 1 tablet (75 mg total) by mouth 2 (two) times daily., Starting 04/16/2013, Until Discontinued, Normal    !! traMADol (ULTRAM) 50 MG tablet Take 2 tablets (100 mg total) by mouth every 8 (eight) hours as needed for pain., Starting 04/16/2013, Until Discontinued, Normal     !! - Potential duplicate medications found. Please discuss with provider.     2.  The patient was instructed in symptomatic care and handouts were given. 3.  The patient was told to return if becoming worse in any way, if no better in 3 or 4 days, and  given some red flag symptoms such as difficulty breathing or worsening pain that would indicate earlier return. 4.  Follow up with Dr. Charlton Haws as soon as possible.    Reuben Likes, MD 04/16/13 2123

## 2013-04-16 NOTE — ED Notes (Signed)
Pt c/o intermittent chest pains onset last night... Pain increases w/physical activity... Works at [employer]... Dx w/pericarditis... Denies: SOB, HA, blurry vision, edema... He is alert and oriented w/no signs of acute distress

## 2013-11-17 ENCOUNTER — Emergency Department (INDEPENDENT_AMBULATORY_CARE_PROVIDER_SITE_OTHER)
Admission: EM | Admit: 2013-11-17 | Discharge: 2013-11-17 | Disposition: A | Discharge status: Home / Self Care | Payer: Self-pay | Source: Home / Self Care | Attending: Family Medicine | Admitting: Family Medicine

## 2013-11-17 ENCOUNTER — Encounter (HOSPITAL_COMMUNITY): Payer: Self-pay | Admitting: Emergency Medicine

## 2013-11-17 ENCOUNTER — Telehealth (HOSPITAL_COMMUNITY): Payer: Self-pay | Admitting: *Deleted

## 2013-11-17 ENCOUNTER — Other Ambulatory Visit: Payer: Self-pay

## 2013-11-17 DIAGNOSIS — R079 Chest pain, unspecified: Secondary | ICD-10-CM

## 2013-11-17 MED ORDER — TRAMADOL HCL 50 MG PO TABS: 50.0000 mg | ORAL_TABLET | Freq: Four times a day (QID) | ORAL | Status: DC | PRN

## 2013-11-17 MED ORDER — DICLOFENAC SODIUM 75 MG PO TBEC: 75.0000 mg | DELAYED_RELEASE_TABLET | Freq: Two times a day (BID) | ORAL | Status: AC | PRN

## 2013-11-17 NOTE — ED Notes (Signed)
C/o chest pain States he was dx with pericarditis States tramadol and diclofenac was given to him He needs refill on tramadol

## 2013-11-17 NOTE — Discharge Instructions (Signed)
Thank you for coming in today. We do not know exactly what is wrong with your heart. It is important to eventually followup with the cardiologists. Please see Zachary Roy to qualify for reduced or free medical services within the [facility] [facility].  Call her at [phone] today. We can get you an appointment at the [place] [facility] who can send you to cardiology.  Use tramadol and diclofenac as that helps.  Call or go to the emergency room if you get worse, have trouble breathing, have chest pains, or palpitations.   Chest Pain (Nonspecific) It is often hard to give a specific diagnosis for the cause of chest pain. There is always a chance that your pain could be related to something serious, such as a heart attack or a blood clot in the lungs. You need to follow up with your caregiver for further evaluation. CAUSES   Heartburn.  Pneumonia or bronchitis.  Anxiety or stress.  Inflammation around your heart (pericarditis) or lung (pleuritis or pleurisy).  A blood clot in the lung.  A collapsed lung (pneumothorax). It can develop suddenly on its own (spontaneous pneumothorax) or from injury (trauma) to the chest.  Shingles infection (herpes zoster virus). The chest wall is composed of bones, muscles, and cartilage. Any of these can be the source of the pain.  The bones can be bruised by injury.  The muscles or cartilage can be strained by coughing or overwork.  The cartilage can be affected by inflammation and become sore (costochondritis). DIAGNOSIS  Lab tests or other studies, such as X-rays, electrocardiography, stress testing, or cardiac imaging, may be needed to find the cause of your pain.  TREATMENT   Treatment depends on what may be causing your chest pain. Treatment may include:  Acid blockers for heartburn.  Anti-inflammatory medicine.  Pain medicine for inflammatory conditions.  Antibiotics if an infection is present.  You may be advised to  change lifestyle habits. This includes stopping smoking and avoiding alcohol, caffeine, and chocolate.  You may be advised to keep your head raised (elevated) when sleeping. This reduces the chance of acid going backward from your stomach into your esophagus.  Most of the time, nonspecific chest pain will improve within 2 to 3 days with rest and mild pain medicine. HOME CARE INSTRUCTIONS   If antibiotics were prescribed, take your antibiotics as directed. Finish them even if you start to feel better.  For the next few days, avoid physical activities that bring on chest pain. Continue physical activities as directed.  Do not smoke.  Avoid drinking alcohol.  Only take over-the-counter or prescription medicine for pain, discomfort, or fever as directed by your caregiver.  Follow your caregiver's suggestions for further testing if your chest pain does not go away.  Keep any follow-up appointments you made. If you do not go to an appointment, you could develop lasting (chronic) problems with pain. If there is any problem keeping an appointment, you must call to reschedule. SEEK MEDICAL CARE IF:   You think you are having problems from the medicine you are taking. Read your medicine instructions carefully.  Your chest pain does not go away, even after treatment.  You develop a rash with blisters on your chest. SEEK IMMEDIATE MEDICAL CARE IF:   You have increased chest pain or pain that spreads to your arm, neck, jaw, back, or abdomen.  You develop shortness of breath, an increasing cough, or you are coughing up blood.  You have severe  back or abdominal pain, feel nauseous, or vomit.  You develop severe weakness, fainting, or chills.  You have a fever. THIS IS AN EMERGENCY. Do not wait to see if the pain will go away. Get medical help at once. Call your local emergency services (911 in [place] Do not drive yourself to the hospital. MAKE SURE YOU:   Understand these  instructions.  Will watch your condition.  Will get help right away if you are not doing well or get worse. Document Released: 07/17/2005 Document Revised: 12/30/2011 Document Reviewed: 05/12/2008 [facility] Patient Information 2014 [place], [place].

## 2013-11-17 NOTE — ED Notes (Signed)
Pt. called on VM on 1/26 asking for a refill of Tramadol for chest pain.  Zachary Roy.  1/28 I called pt. and he said he came in today for a recheck and got the refill.  I explained that we are not primary care and do not do refills, so I would  have told him to come back.   Zachary Roy, Zachary Roy 11/17/2013
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
## 2013-12-28 ENCOUNTER — Ambulatory Visit: Payer: Self-pay | Admitting: Cardiovascular Disease

## 2014-01-05 ENCOUNTER — Encounter: Payer: Self-pay | Admitting: Cardiovascular Disease

## 2014-01-05 ENCOUNTER — Ambulatory Visit (INDEPENDENT_AMBULATORY_CARE_PROVIDER_SITE_OTHER): Payer: BC Managed Care – PPO | Admitting: Cardiovascular Disease

## 2014-01-05 VITALS — BP 138/86 | HR 85 | Ht 71.0 in | Wt 154.8 lb

## 2014-01-05 DIAGNOSIS — Z0000 Encounter for general adult medical examination without abnormal findings: Secondary | ICD-10-CM

## 2014-01-05 DIAGNOSIS — F419 Anxiety disorder, unspecified: Secondary | ICD-10-CM

## 2014-01-05 DIAGNOSIS — R079 Chest pain, unspecified: Secondary | ICD-10-CM

## 2014-01-05 DIAGNOSIS — F411 Generalized anxiety disorder: Secondary | ICD-10-CM

## 2014-01-05 DIAGNOSIS — IMO0002 Reserved for concepts with insufficient information to code with codable children: Secondary | ICD-10-CM

## 2014-01-05 LAB — C-REACTIVE PROTEIN: CRP: <0.5 mg/dL (ref 0.5–20.0)

## 2014-01-05 LAB — SEDIMENTATION RATE: Sed Rate: 2 mm/hr (ref 0–22)

## 2014-01-05 LAB — RHEUMATOID FACTOR

## 2014-01-05 LAB — TSH: TSH: 1.69 u[IU]/mL (ref 0.35–5.50)

## 2014-01-05 LAB — T4, FREE: Free T4: 0.95 ng/dL (ref 0.60–1.60)

## 2014-01-05 MED ORDER — TRAMADOL HCL 50 MG PO TABS: 50.0000 mg | ORAL_TABLET | Freq: Two times a day (BID) | ORAL | Status: AC | PRN

## 2014-01-05 MED ORDER — DIAZEPAM 5 MG PO TABS
5.0000 mg | ORAL_TABLET | ORAL | Status: DC
Start: 2014-01-05 — End: 2014-03-21

## 2014-01-05 NOTE — Assessment & Plan Note (Signed)
Very atypical Doubt chronic pericardial disease  May be related to his sickle trait and asthma.  Check ESR, CRP, ANA.  MRI  to assess structure of myocardium and pericardium  As wll as any other chest abnormalities  Valium for anxiety with scan  and ETT for chest pain.  Needs primary care doctor  Will try to arrange at [facility] or [facility].  Continue tramadol

## 2014-01-05 NOTE — Progress Notes (Signed)
Patient ID: Zachary Roy, male   DOB: [birth year], 28 y.o.   MRN: [id number] 28 yo seen in ER 03/08/12  for atypical chest pain. R/O normal ECG and CXR. Reviewed records. Dx with constrochondritis or pericarditis. Improved with NSAI's and colchicine. Still with intermitant sharp pains. Sometimes worse in left shoulder with over head movement. Seems muscular/ No dyspnea or pleurisy. No palpitaitons or syncope. Compliant with meds. No recent trauma. Uses treadmill at times no recent trauma. History of sickle cell trait. Denies drugs or ETOH.   Has continued to have atypical pains.  All over chest. Sometimes with deep breath.  Some NSAI's make his stomach upset.  No fevers  No full blown sickle cell crisis Needs primary care MD  Has not had w/u for Lupus or connective tissue disease    ROS: Denies fever, malais, weight loss, blurry vision, decreased visual acuity, cough, sputum, SOB, hemoptysis, pleuritic pain, palpitaitons, heartburn, abdominal pain, melena, lower extremity edema, claudication, or rash.  All other systems reviewed and negative  General: Affect appropriate Healthy:  appears stated age HEENT: normal Neck supple with no adenopathy JVP normal no bruits no thyromegaly Lungs clear with no wheezing and good diaphragmatic motion Heart:  S1/S2 no murmur, no rub, gallop or click PMI normal Abdomen: benighn, BS positve, no tenderness, no AAA no bruit.  No HSM or HJR Distal pulses intact with no bruits No edema Neuro non-focal Skin warm and dry No muscular weakness   Current Outpatient Prescriptions  Medication Sig Dispense Refill  . acetaminophen (TYLENOL) 325 MG tablet Take 325 mg by mouth every 6 (six) hours as needed. For pain      . diclofenac (VOLTAREN) 75 MG EC tablet Take 1 tablet (75 mg total) by mouth 2 (two) times daily as needed.  60 tablet  0  . traMADol (ULTRAM) 50 MG tablet Take 1 tablet (50 mg total) by mouth every 6 (six) hours as needed.  50 tablet  0  .  [DISCONTINUED] colchicine 0.6 MG tablet 1 daily for pericarditis  30 tablet  2   No current facility-administered medications for this visit.    Allergies  Motrin; Aspirin; and Codeine  Electrocardiogram:  SR rate 73  Voltage for LVH   Assessment and Plan

## 2014-01-05 NOTE — Patient Instructions (Signed)
Your physician recommends that you schedule a follow-up appointment in:   3 MONTHS [facility] Your physician recommends that you continue on your current medications as directed. Please refer to the Current Medication list given to you today.  Your physician recommends that you return for lab work in:  TODAY   RF TSH  T4  SED  RATE  ANA   CRP  Your physician has requested that you have a cardiac MRI. Cardiac MRI uses a computer to create images of your heart as its beating, producing both still and moving pictures of your heart and major blood vessels. For further information please visit [email or web address]. Please follow the instruction sheet given to you today for more information. Your physician has requested that you have an exercise tolerance test. For further information please visit [email or web address]. Please also follow instruction sheet, as given.   You have been referred to   NEEDS  APPT   IN THE  NEXT MO   WITH PMD  EITHER  AT  [facility] OR  [facility] LOCATIONS

## 2014-01-06 LAB — ANA: Anti Nuclear Antibody(ANA): NEGATIVE

## 2014-01-07 ENCOUNTER — Telehealth: Payer: Self-pay | Admitting: *Deleted

## 2014-01-07 NOTE — Telephone Encounter (Signed)
Pt is aware of lab results He would like to know from Dr. Eden EmmsNishan does he need to follow-through with the MRI & the ETT?  ETT is scheduled for 01/13/14  Forwarded to Dr. Lynnette CaffeyNishan  Debbie Kahlen Morais RN

## 2014-01-07 NOTE — Telephone Encounter (Signed)
Pt is aware & I have given him directions to the [facility] office for the ETT He is aware our office will call him once the cardiac MRI is scheduled Mylo Redebbie Aleksi Brummet RN

## 2014-01-07 NOTE — Telephone Encounter (Signed)
Yes follow through with heart testing

## 2014-01-07 NOTE — Telephone Encounter (Signed)
Message copied by Barrie FolkGRAY, Elesa Garman F on Fri Jan 07, 2014  2:27 PM ------      Message from: Wendall StadeNISHAN, PETER C      Created: Wed Jan 05, 2014  4:03 PM       Labs reviewed and are normal.  No changes in medication or F/U needed.       ------

## 2014-01-13 ENCOUNTER — Encounter: Payer: Self-pay | Admitting: Cardiovascular Disease

## 2014-01-13 ENCOUNTER — Ambulatory Visit (HOSPITAL_COMMUNITY)
Admission: RE | Admit: 2014-01-13 | Discharge: 2014-01-13 | Disposition: A | Discharge status: Home / Self Care | Payer: BC Managed Care – PPO | Source: Ambulatory Visit | Attending: Cardiology | Admitting: Cardiology

## 2014-01-13 DIAGNOSIS — R079 Chest pain, unspecified: Secondary | ICD-10-CM

## 2014-01-17 ENCOUNTER — Telehealth: Payer: Self-pay | Admitting: *Deleted

## 2014-01-17 NOTE — Telephone Encounter (Signed)
Normal ETT ----- Message ----- From: Yvetta CoderPamela A Phillips Sent: 01/14/2014 6:58 AM To: Wendall StadePeter C Nishan, MD    PT  AWARE  OF  GXT  RESULTS .Zack Seal/CY

## 2014-02-02 ENCOUNTER — Ambulatory Visit (HOSPITAL_COMMUNITY): Admission: RE | Admit: 2014-02-02 | Payer: BC Managed Care – PPO | Source: Ambulatory Visit

## 2014-02-08 ENCOUNTER — Ambulatory Visit: Payer: BC Managed Care – PPO | Admitting: Physician Assistant

## 2014-02-10 ENCOUNTER — Encounter: Payer: Self-pay | Admitting: Cardiovascular Disease

## 2014-02-21 ENCOUNTER — Ambulatory Visit: Payer: BC Managed Care – PPO | Admitting: Internal Medicine

## 2014-03-02 ENCOUNTER — Ambulatory Visit (HOSPITAL_COMMUNITY): Admission: RE | Admit: 2014-03-02 | Payer: BC Managed Care – PPO | Source: Ambulatory Visit

## 2014-03-11 ENCOUNTER — Encounter: Payer: Self-pay | Admitting: Cardiovascular Disease

## 2014-03-21 ENCOUNTER — Encounter: Payer: Self-pay | Admitting: Internal Medicine

## 2014-03-21 ENCOUNTER — Other Ambulatory Visit (INDEPENDENT_AMBULATORY_CARE_PROVIDER_SITE_OTHER): Payer: BC Managed Care – PPO

## 2014-03-21 ENCOUNTER — Ambulatory Visit (INDEPENDENT_AMBULATORY_CARE_PROVIDER_SITE_OTHER): Payer: BC Managed Care – PPO | Admitting: Internal Medicine

## 2014-03-21 ENCOUNTER — Ambulatory Visit (INDEPENDENT_AMBULATORY_CARE_PROVIDER_SITE_OTHER)
Admission: RE | Admit: 2014-03-21 | Discharge: 2014-03-21 | Disposition: A | Discharge status: Home / Self Care | Payer: BC Managed Care – PPO | Source: Ambulatory Visit | Attending: Internal Medicine | Admitting: Internal Medicine

## 2014-03-21 VITALS — BP 118/60 | HR 83 | Temp 98.3°F | Resp 16 | Ht 71.0 in | Wt 152.0 lb

## 2014-03-21 DIAGNOSIS — Z0000 Encounter for general adult medical examination without abnormal findings: Secondary | ICD-10-CM

## 2014-03-21 DIAGNOSIS — R079 Chest pain, unspecified: Secondary | ICD-10-CM

## 2014-03-21 DIAGNOSIS — D573 Sickle-cell trait: Secondary | ICD-10-CM

## 2014-03-21 LAB — CBC WITH DIFFERENTIAL/PLATELET
BASOS ABS: 0 10*3/uL (ref 0.0–0.1)
Basophils Relative: 0.3 % (ref 0.0–3.0)
EOS ABS: 0.2 10*3/uL (ref 0.0–0.7)
EOS PCT: 3.2 % (ref 0.0–5.0)
HEMATOCRIT: 47 % (ref 39.0–52.0)
Hemoglobin: 15.7 g/dL (ref 13.0–17.0)
LYMPHS ABS: 1.2 10*3/uL (ref 0.7–4.0)
Lymphocytes Relative: 22.3 % (ref 12.0–46.0)
MCHC: 33.3 g/dL (ref 30.0–36.0)
MCV: 86.2 fl (ref 78.0–100.0)
Monocytes Absolute: 0.4 10*3/uL (ref 0.1–1.0)
Monocytes Relative: 6.8 % (ref 3.0–12.0)
NEUTROS PCT: 67.4 % (ref 43.0–77.0)
Neutro Abs: 3.6 10*3/uL (ref 1.4–7.7)
PLATELETS: 207 10*3/uL (ref 150.0–400.0)
RBC: 5.45 Mil/uL (ref 4.22–5.81)
RDW: 13 % (ref 11.5–15.5)
WBC: 5.4 10*3/uL (ref 4.0–10.5)

## 2014-03-21 LAB — COMPREHENSIVE METABOLIC PANEL
ALBUMIN: 3.7 g/dL (ref 3.5–5.2)
ALK PHOS: 35 U/L — AB (ref 39–117)
ALT: 23 U/L (ref 0–53)
AST: 18 U/L (ref 0–37)
BILIRUBIN TOTAL: 3.7 mg/dL — AB (ref 0.2–1.2)
BUN: 11 mg/dL (ref 6–23)
CO2: 31 mEq/L (ref 19–32)
Calcium: 9 mg/dL (ref 8.4–10.5)
Chloride: 104 mEq/L (ref 96–112)
Creatinine, Ser: 1.2 mg/dL (ref 0.4–1.5)
GFR: 96.3 mL/min (ref >60.00)
GLUCOSE: 72 mg/dL (ref 70–99)
POTASSIUM: 4.3 meq/L (ref 3.5–5.1)
SODIUM: 141 meq/L (ref 135–145)
Total Protein: 5.9 g/dL — ABNORMAL LOW (ref 6.0–8.3)

## 2014-03-21 LAB — TSH: TSH: 0.8 u[IU]/mL (ref 0.35–4.50)

## 2014-03-21 LAB — LIPID PANEL
CHOL/HDL RATIO: 3
Cholesterol: 177 mg/dL (ref 0–200)
HDL: 59.3 mg/dL (ref >39.00)
LDL Cholesterol: 107 mg/dL — ABNORMAL HIGH (ref 0–99)
Triglycerides: 54 mg/dL (ref 0.0–149.0)
VLDL: 10.8 mg/dL (ref 0.0–40.0)

## 2014-03-21 LAB — SEDIMENTATION RATE: SED RATE: 2 mm/h (ref 0–22)

## 2014-03-21 NOTE — Progress Notes (Signed)
Pre visit review using our clinic review tool, if applicable. No additional management support is needed unless otherwise documented below in the visit note. 

## 2014-03-21 NOTE — Progress Notes (Signed)
Subjective:    Patient ID: Zachary Roy, male    DOB: 1985/11/21, 28 y.o.   MRN: [id number]  Chest Pain  This is a chronic problem. Episode onset: for 2 years. The onset quality is gradual. The problem occurs intermittently. The problem has been unchanged. The pain is present in the lateral region (left anterior and lower rib cage). The pain is at a severity of 1/10. The pain is mild. Quality: "throbbing" The pain does not radiate. Pertinent negatives include no abdominal pain, back pain, cough, diaphoresis, fever, nausea, palpitations, shortness of breath or vomiting.  His past medical history is significant for sickle cell disease. Past medical history comments: pericarditis in 2013 Prior diagnostic workup includes exercise treadmill test, echocardiogram and stress echo.      Review of Systems  Constitutional: Negative.  Negative for fever, chills, diaphoresis, appetite change and fatigue.  HENT: Negative.   Eyes: Negative.   Respiratory: Negative.  Negative for apnea, cough, choking, chest tightness, shortness of breath, wheezing and stridor.   Cardiovascular: Positive for chest pain. Negative for palpitations and leg swelling.  Gastrointestinal: Negative.  Negative for nausea, vomiting, abdominal pain, diarrhea, constipation and blood in stool.  Endocrine: Negative.   Genitourinary: Negative.   Musculoskeletal: Negative.  Negative for arthralgias, back pain, myalgias, neck pain and neck stiffness.  Skin: Negative.  Negative for rash.  Allergic/Immunologic: Negative.   Neurological: Negative.   Hematological: Negative.  Negative for adenopathy. Does not bruise/bleed easily.  Psychiatric/Behavioral: Negative.        Objective:   Physical Exam  Vitals reviewed. Constitutional: He is oriented to person, place, and time. He appears well-developed and well-nourished.  Non-toxic appearance. He does not have a sickly appearance. He does not appear ill. No distress.  HENT:  Head:  Normocephalic and atraumatic.  Mouth/Throat: Oropharynx is clear and moist. No oropharyngeal exudate.  Eyes: Conjunctivae are normal. Right eye exhibits no discharge. Left eye exhibits no discharge. No scleral icterus.  Neck: Normal range of motion. Neck supple. No JVD present. No tracheal deviation present. No thyromegaly present.  Cardiovascular: Normal rate, regular rhythm, normal heart sounds and intact distal pulses.  Exam reveals no gallop and no friction rub.   No murmur heard. Pulses:      Carotid pulses are 1+ on the right side, and 1+ on the left side.      Radial pulses are 1+ on the right side, and 1+ on the left side.       Femoral pulses are 1+ on the right side, and 1+ on the left side.      Popliteal pulses are 1+ on the right side, and 1+ on the left side.       Dorsalis pedis pulses are 1+ on the right side, and 1+ on the left side.       Posterior tibial pulses are 1+ on the right side, and 1+ on the left side.    Pulmonary/Chest: Effort normal and breath sounds normal. No stridor. No respiratory distress. He has no decreased breath sounds. He has no wheezes. He has no rhonchi. He has no rales. Chest wall is not dull to percussion. He exhibits tenderness and bony tenderness. He exhibits no mass, no laceration, no crepitus, no edema, no deformity, no swelling and no retraction. Right breast exhibits no inverted nipple, no mass, no nipple discharge, no skin change and no tenderness. Left breast exhibits no inverted nipple, no mass, no nipple discharge, no skin change and no  tenderness. Breasts are symmetrical.  Abdominal: Soft. Bowel sounds are normal. He exhibits no distension and no mass. There is no tenderness. There is no rebound and no guarding. Hernia confirmed negative in the right inguinal area and confirmed negative in the left inguinal area.  Genitourinary: Testes normal and penis normal. Right testis shows no mass, no swelling and no tenderness. Right testis is descended.  Left testis shows no mass, no swelling and no tenderness. Left testis is descended. Circumcised. No penile erythema or penile tenderness. No discharge found.  Musculoskeletal: Normal range of motion. He exhibits no edema and no tenderness.  Lymphadenopathy:    He has no cervical adenopathy.       Right: No inguinal adenopathy present.       Left: No inguinal adenopathy present.  Neurological: He is oriented to person, place, and time.  Skin: Skin is warm and dry. No rash noted. He is not diaphoretic. No erythema. No pallor.  Psychiatric: He has a normal mood and affect. His behavior is normal. Judgment and thought content normal.     Lab Results  Component Value Date   WBC 6.2 03/08/2012   HGB 13.9 03/08/2012   HCT 41.0 03/08/2012   PLT 181 03/08/2012   GLUCOSE 83 03/08/2012   ALT 16 02/25/2012   AST 22 02/25/2012   NA 144 03/08/2012   K 3.8 03/08/2012   CL 105 03/08/2012   CREATININE 1.10 03/08/2012   BUN 8 03/08/2012   CO2 29 02/25/2012   TSH 1.69 01/05/2014       Assessment & Plan:

## 2014-03-21 NOTE — Patient Instructions (Signed)

## 2014-03-22 NOTE — Assessment & Plan Note (Signed)
Exam done Vaccines were reviewed Labs ordered Pt ed material was given 

## 2014-03-22 NOTE — Assessment & Plan Note (Signed)
CBC is normal.

## 2014-03-22 NOTE — Assessment & Plan Note (Signed)
His CXR is normal Labs are normal I think this is MS and have asked him to cont the work-up with cardiology (MRI) and to cont the current meds for pain relief

## 2014-03-23 ENCOUNTER — Ambulatory Visit (HOSPITAL_COMMUNITY): Admission: RE | Admit: 2014-03-23 | Payer: BC Managed Care – PPO | Source: Ambulatory Visit

## 2014-04-05 ENCOUNTER — Other Ambulatory Visit: Payer: Self-pay | Admitting: *Deleted

## 2014-04-05 DIAGNOSIS — R079 Chest pain, unspecified: Secondary | ICD-10-CM

## 2014-04-08 ENCOUNTER — Ambulatory Visit (HOSPITAL_COMMUNITY)
Admission: RE | Admit: 2014-04-08 | Discharge: 2014-04-08 | Disposition: A | Discharge status: Home / Self Care | Payer: BC Managed Care – PPO | Source: Ambulatory Visit | Attending: Cardiovascular Disease | Admitting: Cardiovascular Disease

## 2014-04-08 ENCOUNTER — Ambulatory Visit: Payer: BC Managed Care – PPO | Admitting: Cardiovascular Disease

## 2014-04-08 DIAGNOSIS — R079 Chest pain, unspecified: Secondary | ICD-10-CM | POA: Insufficient documentation

## 2014-04-08 LAB — CREATININE, SERUM
Creatinine, Ser: 1.08 mg/dL (ref 0.50–1.35)
GFR calc Af Amer: >90 mL/min (ref >90)
GFR calc non Af Amer: >90 mL/min (ref >90)

## 2014-04-12 ENCOUNTER — Ambulatory Visit (HOSPITAL_COMMUNITY)
Admission: RE | Admit: 2014-04-12 | Discharge: 2014-04-12 | Disposition: A | Discharge status: Home / Self Care | Payer: BC Managed Care – PPO | Source: Ambulatory Visit | Attending: Cardiovascular Disease | Admitting: Cardiovascular Disease

## 2014-04-12 DIAGNOSIS — I319 Disease of pericardium, unspecified: Secondary | ICD-10-CM | POA: Insufficient documentation

## 2014-04-12 MED ORDER — GADOBENATE DIMEGLUMINE 529 MG/ML IV SOLN
25.0000 mL | Freq: Once | INTRAVENOUS | Status: AC | PRN
  Administered 2014-04-12: 24 mL via INTRAVENOUS

## 2014-04-14 ENCOUNTER — Telehealth: Payer: Self-pay | Admitting: *Deleted

## 2014-04-14 NOTE — Telephone Encounter (Signed)
PER PT   WOULD LIKE  MOTHER  AWARE OF   MRI RESULTS  PT'S MOM  NOTIFIED./CY

## 2014-05-02 ENCOUNTER — Encounter: Payer: Self-pay | Admitting: Cardiovascular Disease

## 2014-06-09 ENCOUNTER — Encounter: Payer: Self-pay | Admitting: Cardiovascular Disease

## 2014-06-09 ENCOUNTER — Ambulatory Visit (INDEPENDENT_AMBULATORY_CARE_PROVIDER_SITE_OTHER): Payer: BC Managed Care – PPO | Admitting: Cardiovascular Disease

## 2014-06-09 VITALS — BP 124/78 | HR 72 | Ht 70.0 in | Wt 150.0 lb

## 2014-06-09 DIAGNOSIS — R079 Chest pain, unspecified: Secondary | ICD-10-CM | POA: Insufficient documentation

## 2014-06-09 DIAGNOSIS — R0789 Other chest pain: Secondary | ICD-10-CM

## 2014-06-09 DIAGNOSIS — Z01810 Encounter for preprocedural cardiovascular examination: Secondary | ICD-10-CM

## 2014-06-09 LAB — BASIC METABOLIC PANEL
BUN: 10 mg/dL (ref 6–23)
CO2: 30 meq/L (ref 19–32)
Calcium: 8.8 mg/dL (ref 8.4–10.5)
Chloride: 106 mEq/L (ref 96–112)
Creatinine, Ser: 1 mg/dL (ref 0.4–1.5)
GFR: 111.22 mL/min (ref >60.00)
GLUCOSE: 77 mg/dL (ref 70–99)
POTASSIUM: 3.7 meq/L (ref 3.5–5.1)
SODIUM: 140 meq/L (ref 135–145)

## 2014-06-09 NOTE — Progress Notes (Signed)
Patient ID: Zachary Roy, male   DOB: December 10, 1985, 28 y.o.   MRN: [id number] 28 yo seen in ER 03/08/12 for atypical chest pain. R/O normal ECG and CXR. Reviewed records. Dx with constrochondritis or pericarditis. Improved with NSAI's and colchicine. Still with intermitant sharp pains. Sometimes worse in left shoulder with over head movement. Seems muscular/ No dyspnea or pleurisy. No palpitaitons or syncope. Compliant with meds. No recent trauma. Uses treadmill at times no recent trauma. History of sickle cell trait. Denies drugs or ETOH.   Has continued to have atypical pains. All over chest. Sometimes with deep breath. Some NSAI's make his stomach upset. No fevers No full blown sickle cell crisis  Needs primary care MD Has not had w/u for Lupus or connective tissue disease   01/13/14 ETT normal even with chest pain on exercise  01/05/14  MRI normal no gad uptake and normal pericardium 03/21/14  CXR NAD  March 15  ANA, ESR, C reactive protein and TSH normal   6/15  Cr 1.08  Continues to have chest pain central mostly sharp and more frequently exertional   Sometimes tramadol helps sometimes it does not Can last minutes   ROS: Denies fever, malais, weight loss, blurry vision, decreased visual acuity, cough, sputum, SOB, hemoptysis, pleuritic pain, palpitaitons, heartburn, abdominal pain, melena, lower extremity edema, claudication, or rash.  All other systems reviewed and negative  General: Affect appropriate Healthy:  appears stated age 60: normal Neck supple with no adenopathy JVP normal no bruits no thyromegaly Lungs clear with no wheezing and good diaphragmatic motion Heart:  S1/S2 no murmur, no rub, gallop or click PMI normal Abdomen: benighn, BS positve, no tenderness, no AAA no bruit.  No HSM or HJR Distal pulses intact with no bruits No edema Neuro non-focal Skin warm and dry No muscular weakness   Current Outpatient Prescriptions  Medication Sig Dispense Refill  .  acetaminophen (TYLENOL) 325 MG tablet Take 325 mg by mouth every 6 (six) hours as needed. For pain      . diclofenac (VOLTAREN) 75 MG EC tablet Take 1 tablet (75 mg total) by mouth 2 (two) times daily as needed.  60 tablet  0  . traMADol (ULTRAM) 50 MG tablet Take 1 tablet (50 mg total) by mouth 2 (two) times daily as needed.  50 tablet  5  . [DISCONTINUED] colchicine 0.6 MG tablet 1 daily for pericarditis  30 tablet  2   No current facility-administered medications for this visit.    Allergies  Motrin; Aspirin; and Codeine  Electrocardiogram:  3/15  NSR normal minimal voltage criteria for LVH  Assessment and Plan

## 2014-06-09 NOTE — Patient Instructions (Signed)
The current medical regimen is effective;  continue present plan and medications.  Please have blood work today. (BMP)  Your physician has requested that you have cardiac CT. Cardiac computed tomography (CT) is a painless test that uses an x-ray machine to take clear, detailed pictures of your heart. For further information please visit [email or web address] Please follow instruction sheet as given.  Further follow up will be based on these results.

## 2014-06-09 NOTE — Assessment & Plan Note (Signed)
Continue chest pain including on ETT despite normal ECG response, Normal cardiac MRI with no evidence of pericardial disease and normal lab work.  Cannot r/o anomalous coronary artery or bridging vessel.  Needs cardiac CT to definitively clear heart.  No boney abnormalities on CXR.

## 2014-06-10 ENCOUNTER — Ambulatory Visit: Payer: BC Managed Care – PPO | Admitting: Cardiovascular Disease

## 2014-06-14 ENCOUNTER — Encounter: Payer: Self-pay | Admitting: Cardiovascular Disease

## 2014-06-21 ENCOUNTER — Encounter: Payer: Self-pay | Admitting: Cardiovascular Disease

## 2014-06-21 ENCOUNTER — Ambulatory Visit (HOSPITAL_COMMUNITY)
Admission: RE | Admit: 2014-06-21 | Discharge: 2014-06-21 | Disposition: A | Discharge status: Home / Self Care | Payer: BC Managed Care – PPO | Source: Ambulatory Visit | Attending: Cardiovascular Disease | Admitting: Cardiovascular Disease

## 2014-06-21 ENCOUNTER — Encounter (HOSPITAL_COMMUNITY): Payer: Self-pay

## 2014-06-21 DIAGNOSIS — R079 Chest pain, unspecified: Secondary | ICD-10-CM | POA: Insufficient documentation

## 2014-06-21 DIAGNOSIS — R0789 Other chest pain: Secondary | ICD-10-CM | POA: Insufficient documentation

## 2014-06-21 IMAGING — CT CT HEART MORP W/ CTA COR W/ SCORE W/ CA W/CM &/OR W/O CM
1 of 10 series · 1 of 20 positions shown, 2 images · IV contrast (Iodine)
Comparison: none

ADDENDUM:
OVER-READ INTERPRETATION  CT CHEST

The following report is an over-read performed by radiologist Dr.
KRISELD [REDACTED] on [DATE]. This
over-read does not include interpretation of cardiac or coronary
anatomy or pathology. The CTA interpretation by the cardiologist is
attached.
CLINICAL DATA: Chest pain
EXAM:
Cardiac CTA
MEDICATIONS:
Sub lingual nitro. 4mg and lopressor 10mg
TECHNIQUE: The patient was scanned on a [employer] [REDACTED]ice scanner. Gantry
rotation speed was 270 msecs. Collimation was .9mm. A 100 kV
prospective scan was triggered in the descending thoracic aorta at
111 HU's with 5% padding centered around 78% of the R-R interval.
Average HR during the scan was 72 bpm. The 3D data set was
interpreted on a dedicated work station using MPR, MIP and VRT
modes. A total of 80cc of contrast was used.

[Series 300: locator · axial · 0.35mm/px · z∈[-98,-98]mm · 1 of 1 slices shown, 2 images]
[im 1/1  vessel]
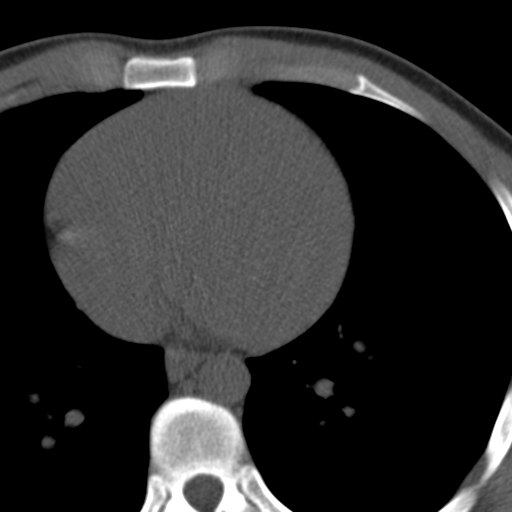
[im 1/1  lung]
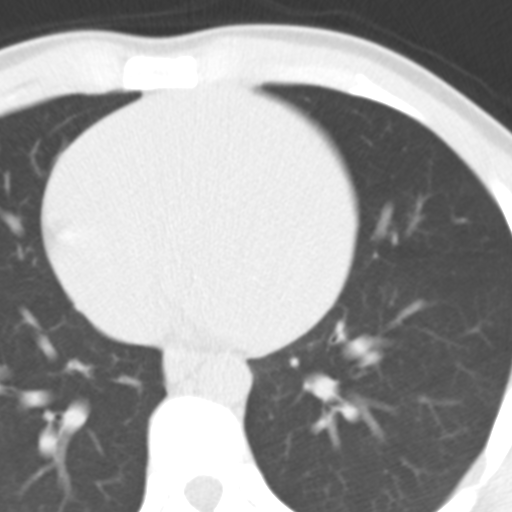

[1 of 20 positions shown; findings below may reference images not displayed]

FINDINGS: Visualized lungs are essentially clear. No suspicious pulmonary
nodules.

Visualized soft tissues are unremarkable. No suspicious thoracic
lymphadenopathy.

Visualized osseous structures are within normal limits.
IMPRESSION: No significant extracardiac findings.
FINDINGS: Non-cardiac: See separate report from [REDACTED]. No
significant findings on limited lung and soft tissue windows.

Calcium Score: 0 Ascending Aorta Normal 2.8 cm Descending thoracic
aorta normal 1.8 cm

Coronary Arteries:Left  dominant with no anomalies

LM:  Short segment and normal

LAD:  normal

D1:  Large artery and normal

D2:  Small artery and normal

D3:  Small artery and normal

Circumflex:  Large left dominant

OM1: Normal

OM2: Normal

RCA:  Small non dominant normal origin normal

PDA:  Left sided and normal

PLA:  Left sided and normal
IMPRESSION: Normal left dominant coronary arteries with no congenital anomalies

Calcium Score 0

Normal Ascending and descending thoracic arota

KRISELD

## 2014-06-21 MED ORDER — METOPROLOL TARTRATE 1 MG/ML IV SOLN
INTRAVENOUS | Status: AC
  Filled 2014-06-21: qty 5

## 2014-06-21 MED ORDER — METOPROLOL TARTRATE 1 MG/ML IV SOLN
5.0000 mg | Freq: Once | INTRAVENOUS | Status: AC
  Administered 2014-06-21: 5 mg via INTRAVENOUS
  Filled 2014-06-21: qty 5

## 2014-06-21 MED ORDER — NITROGLYCERIN 0.4 MG SL SUBL
SUBLINGUAL_TABLET | SUBLINGUAL | Status: AC
  Administered 2014-06-21: 0.4 mg
  Filled 2014-06-21: qty 1

## 2014-06-21 MED ORDER — IOHEXOL 350 MG/ML SOLN
80.0000 mL | Freq: Once | INTRAVENOUS | Status: AC | PRN
  Administered 2014-06-21: 80 mL via INTRAVENOUS

## 2015-02-17 ENCOUNTER — Emergency Department (INDEPENDENT_AMBULATORY_CARE_PROVIDER_SITE_OTHER)
Admission: EM | Admit: 2015-02-17 | Discharge: 2015-02-17 | Disposition: A | Discharge status: Home / Self Care | Payer: Self-pay | Source: Home / Self Care | Attending: Family Medicine | Admitting: Family Medicine

## 2015-02-17 ENCOUNTER — Encounter (HOSPITAL_COMMUNITY): Payer: Self-pay

## 2015-02-17 DIAGNOSIS — N342 Other urethritis: Secondary | ICD-10-CM

## 2015-02-17 DIAGNOSIS — J302 Other seasonal allergic rhinitis: Secondary | ICD-10-CM

## 2015-02-17 MED ORDER — CETIRIZINE HCL 10 MG PO TABS: 10.0000 mg | ORAL_TABLET | Freq: Every day | ORAL | Status: AC

## 2015-02-17 MED ORDER — DOXYCYCLINE HYCLATE 100 MG PO CAPS
100.0000 mg | ORAL_CAPSULE | Freq: Two times a day (BID) | ORAL | Status: DC
Start: 2015-02-17 — End: 2015-10-09

## 2015-02-17 MED ORDER — FLUTICASONE PROPIONATE 50 MCG/ACT NA SUSP: 1.0000 | Freq: Two times a day (BID) | NASAL | Status: AC

## 2015-02-17 NOTE — ED Notes (Signed)
Not sexually active, but has pain w urination. Was advised by friend he could get something off toilet seat. C/o URI type syx , minimal relief w OTC medications

## 2015-02-17 NOTE — Discharge Instructions (Signed)
Reduce caffeine, drink plenty of water, take medicine as prescribed
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
## 2015-10-04 ENCOUNTER — Encounter (HOSPITAL_COMMUNITY): Payer: Self-pay | Admitting: Emergency Medicine

## 2015-10-04 ENCOUNTER — Emergency Department (INDEPENDENT_AMBULATORY_CARE_PROVIDER_SITE_OTHER)
Admission: EM | Admit: 2015-10-04 | Discharge: 2015-10-04 | Disposition: A | Discharge status: Home / Self Care | Payer: Self-pay | Source: Home / Self Care

## 2015-10-04 DIAGNOSIS — N4889 Other specified disorders of penis: Secondary | ICD-10-CM

## 2015-10-04 LAB — POCT URINALYSIS DIP (DEVICE)
BILIRUBIN URINE: NEGATIVE
GLUCOSE, UA: NEGATIVE mg/dL
Hgb urine dipstick: NEGATIVE
Ketones, ur: NEGATIVE mg/dL
LEUKOCYTES UA: NEGATIVE
Nitrite: NEGATIVE
Protein, ur: NEGATIVE mg/dL
Specific Gravity, Urine: 1.02 (ref 1.005–1.030)
UROBILINOGEN UA: 1 mg/dL (ref 0.0–1.0)
pH: 7 (ref 5.0–8.0)

## 2015-10-04 NOTE — Discharge Instructions (Signed)
°  Limit the use of perfume body washes as they may be drying the skin around your meatus.  There are no signs of infection in your urine at this time'  There is no indication for antibiotics.
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
## 2015-10-04 NOTE — ED Notes (Signed)
C/o penis pain for two weeks Have tried doxycycline b-13

## 2015-10-06 ENCOUNTER — Emergency Department (INDEPENDENT_AMBULATORY_CARE_PROVIDER_SITE_OTHER)
Admission: EM | Admit: 2015-10-06 | Discharge: 2015-10-06 | Disposition: A | Discharge status: Home / Self Care | Payer: Self-pay | Source: Home / Self Care | Attending: Family Medicine | Admitting: Family Medicine

## 2015-10-06 ENCOUNTER — Encounter (HOSPITAL_COMMUNITY): Payer: Self-pay | Admitting: *Deleted

## 2015-10-06 ENCOUNTER — Other Ambulatory Visit (HOSPITAL_COMMUNITY)
Admission: RE | Admit: 2015-10-06 | Discharge: 2015-10-06 | Disposition: A | Discharge status: Home / Self Care | Payer: Self-pay | Source: Ambulatory Visit | Attending: Family Medicine | Admitting: Family Medicine

## 2015-10-06 DIAGNOSIS — Z711 Person with feared health complaint in whom no diagnosis is made: Secondary | ICD-10-CM

## 2015-10-06 DIAGNOSIS — Z113 Encounter for screening for infections with a predominantly sexual mode of transmission: Secondary | ICD-10-CM | POA: Insufficient documentation

## 2015-10-06 MED ORDER — AZITHROMYCIN 250 MG PO TABS
1000.0000 mg | ORAL_TABLET | Freq: Once | ORAL | Status: AC
  Administered 2015-10-06: 1000 mg via ORAL

## 2015-10-06 MED ORDER — CEFTRIAXONE SODIUM 250 MG IJ SOLR
250.0000 mg | Freq: Once | INTRAMUSCULAR | Status: AC
  Administered 2015-10-06: 250 mg via INTRAMUSCULAR

## 2015-10-06 MED ORDER — IPRATROPIUM BROMIDE 0.06 % NA SOLN
2.0000 | Freq: Four times a day (QID) | NASAL | Status: AC
Start: 2015-10-06 — End: ?

## 2015-10-06 MED ORDER — CEFTRIAXONE SODIUM 250 MG IJ SOLR
INTRAMUSCULAR | Status: AC
  Filled 2015-10-06: qty 250

## 2015-10-06 MED ORDER — AZITHROMYCIN 250 MG PO TABS
ORAL_TABLET | ORAL | Status: AC
  Filled 2015-10-06: qty 4
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
## 2015-10-06 NOTE — ED Notes (Signed)
Pt  Has  Two  Symptoms   He  Has   Symptoms  Of  Cough  /   Congestion   X   1  Week         pt  Also  Reports  Symptoms  Of  Penile irritation     Seen  ucc     2  Days  Ago  For  The     Penile   Problem

## 2015-10-06 NOTE — Discharge Instructions (Signed)
We will call with positive test results and treat as indicated. Drink plenty of fluids as discussed, use medicine as prescribed, and mucinex or delsym for cough. Return or see your doctor if further problems

## 2015-10-07 LAB — HIV ANTIBODY (ROUTINE TESTING W REFLEX): HIV Screen 4th Generation wRfx: NONREACTIVE

## 2015-10-07 LAB — RPR: RPR Ser Ql: NONREACTIVE

## 2015-10-09 ENCOUNTER — Emergency Department (INDEPENDENT_AMBULATORY_CARE_PROVIDER_SITE_OTHER)
Admission: EM | Admit: 2015-10-09 | Discharge: 2015-10-09 | Disposition: A | Discharge status: Home / Self Care | Payer: Self-pay | Source: Home / Self Care

## 2015-10-09 ENCOUNTER — Encounter (HOSPITAL_COMMUNITY): Payer: Self-pay | Admitting: Emergency Medicine

## 2015-10-09 DIAGNOSIS — N4889 Other specified disorders of penis: Secondary | ICD-10-CM

## 2015-10-09 LAB — CYTOLOGY, (ORAL, ANAL, URETHRAL) ANCILLARY ONLY
Chlamydia: NEGATIVE
Neisseria Gonorrhea: NEGATIVE

## 2015-10-09 MED ORDER — DOXYCYCLINE HYCLATE 100 MG PO CAPS: 100.0000 mg | ORAL_CAPSULE | Freq: Two times a day (BID) | ORAL | Status: AC

## 2015-10-09 NOTE — Discharge Instructions (Signed)
You need a referral to urology.  All your tests are negative

## 2015-10-09 NOTE — ED Notes (Signed)
Pt returns today with continued scrotal irritation and throb pain s/p treatment here 12/14, 12/16 STD cultures negative Given Azithromycin and Rocephin, negative urine culture No swelling or penile d/c reported
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
## 2015-12-07 ENCOUNTER — Telehealth (HOSPITAL_COMMUNITY): Payer: Self-pay | Admitting: Emergency Medicine

## 2015-12-07 NOTE — ED Notes (Signed)
Pt came in to get lab results from 12/16... Pt ID 'd properly w/Drivers License  Results printed and given to pt.
